# Patient Record
Sex: Male | Born: 1980 | Race: White | Hispanic: No | Marital: Married | Smoking: Never smoker
Health system: Southern US, Community
[De-identification: ages and names within clinical notes are randomized; demographics above are authoritative.]

## PROBLEM LIST (undated history)

## (undated) DIAGNOSIS — I10 Essential (primary) hypertension: Secondary | ICD-10-CM

---

## 2008-01-26 ENCOUNTER — Emergency Department: Payer: Self-pay | Admitting: Unknown Physician Specialty

## 2016-11-20 ENCOUNTER — Emergency Department
Admission: EM | Admit: 2016-11-20 | Discharge: 2016-11-20 | Disposition: A | Discharge status: Home / Self Care | Payer: Managed Care, Other (non HMO) | Attending: Emergency Medicine | Admitting: Emergency Medicine

## 2016-11-20 ENCOUNTER — Emergency Department: Payer: Managed Care, Other (non HMO)

## 2016-11-20 ENCOUNTER — Encounter: Payer: Self-pay | Admitting: Emergency Medicine

## 2016-11-20 DIAGNOSIS — N23 Unspecified renal colic: Secondary | ICD-10-CM | POA: Diagnosis not present

## 2016-11-20 DIAGNOSIS — R1031 Right lower quadrant pain: Secondary | ICD-10-CM | POA: Insufficient documentation

## 2016-11-20 DIAGNOSIS — R109 Unspecified abdominal pain: Secondary | ICD-10-CM

## 2016-11-20 LAB — CBC WITH DIFFERENTIAL/PLATELET
Basophils Absolute: 0.1 10*3/uL (ref 0–0.1)
Basophils Relative: 1 %
Eosinophils Absolute: 0.3 10*3/uL (ref 0–0.7)
Eosinophils Relative: 2 %
HEMATOCRIT: 51.4 % (ref 40.0–52.0)
HEMOGLOBIN: 17.6 g/dL (ref 13.0–18.0)
Lymphocytes Relative: 14 %
Lymphs Abs: 1.9 10*3/uL (ref 1.0–3.6)
MCH: 30.4 pg (ref 26.0–34.0)
MCHC: 34.3 g/dL (ref 32.0–36.0)
MCV: 88.7 fL (ref 80.0–100.0)
MONO ABS: 1.1 10*3/uL — AB (ref 0.2–1.0)
Monocytes Relative: 8 %
Neutro Abs: 10 10*3/uL — ABNORMAL HIGH (ref 1.4–6.5)
Neutrophils Relative %: 75 %
Platelets: 212 10*3/uL (ref 150–440)
RBC: 5.8 MIL/uL (ref 4.40–5.90)
RDW: 13.4 % (ref 11.5–14.5)
WBC: 13.4 10*3/uL — ABNORMAL HIGH (ref 3.8–10.6)

## 2016-11-20 LAB — BASIC METABOLIC PANEL
Anion gap: 11 (ref 5–15)
BUN: 13 mg/dL (ref 6–20)
CHLORIDE: 104 mmol/L (ref 101–111)
CO2: 23 mmol/L (ref 22–32)
CREATININE: 1.18 mg/dL (ref 0.61–1.24)
Calcium: 9.6 mg/dL (ref 8.9–10.3)
GFR calc Af Amer: >60 mL/min (ref >60)
GFR calc non Af Amer: >60 mL/min (ref >60)
Glucose, Bld: 126 mg/dL — ABNORMAL HIGH (ref 65–99)
POTASSIUM: 4.1 mmol/L (ref 3.5–5.1)
Sodium: 138 mmol/L (ref 135–145)

## 2016-11-20 LAB — URINALYSIS, COMPLETE (UACMP) WITH MICROSCOPIC
Bacteria, UA: NONE SEEN
Bilirubin Urine: NEGATIVE
GLUCOSE, UA: NEGATIVE mg/dL
Hgb urine dipstick: NEGATIVE
Ketones, ur: NEGATIVE mg/dL
Leukocytes, UA: NEGATIVE
Nitrite: NEGATIVE
PH: 6 (ref 5.0–8.0)
Protein, ur: 30 mg/dL — AB
Specific Gravity, Urine: 1.025 (ref 1.005–1.030)

## 2016-11-20 MED ORDER — ONDANSETRON 4 MG PO TBDP: 4.0000 mg | ORAL_TABLET | Freq: Three times a day (TID) | ORAL | 0 refills | Status: DC | PRN

## 2016-11-20 MED ORDER — SODIUM CHLORIDE 0.9 % IV BOLUS (SEPSIS)
1000.0000 mL | Freq: Once | INTRAVENOUS | Status: AC
  Administered 2016-11-20: 1000 mL via INTRAVENOUS

## 2016-11-20 MED ORDER — KETOROLAC TROMETHAMINE 10 MG PO TABS: 10.0000 mg | ORAL_TABLET | Freq: Three times a day (TID) | ORAL | 0 refills | Status: DC

## 2016-11-20 MED ORDER — KETOROLAC TROMETHAMINE 30 MG/ML IJ SOLN
30.0000 mg | Freq: Once | INTRAMUSCULAR | Status: AC
  Administered 2016-11-20: 30 mg via INTRAVENOUS
  Filled 2016-11-20: qty 1

## 2016-11-20 MED ORDER — CIPROFLOXACIN HCL 500 MG PO TABS: 500.0000 mg | ORAL_TABLET | Freq: Two times a day (BID) | ORAL | 0 refills | Status: DC

## 2016-11-20 MED ORDER — HYDROCODONE-ACETAMINOPHEN 5-325 MG PO TABS: 1.0000 | ORAL_TABLET | Freq: Four times a day (QID) | ORAL | 0 refills | Status: DC | PRN

## 2016-11-20 NOTE — Discharge Instructions (Signed)
Your labs and CT scan indicate you have probably passed a stone. You do have a small stone remaining on the left. Take the pain medicine as needed and the antibiotic as directed. Follow-up with urology for continued symptoms. Consider establishing care with a local provider for routine medical care. Return to the ED as needed.

## 2016-11-20 NOTE — ED Notes (Addendum)
Patient reports urination with blood two weeks ago when pain started, currently pain radiates from right side along abdomen. Pain reported 4/10. No longer has blood in urine that he can see. When problem started patient reports nausea

## 2016-11-20 NOTE — ED Provider Notes (Signed)
[facility] Emergency Department Provider Note ____________________________________________  Time seen: 1126  I have reviewed the triage vital signs and the nursing notes.  HISTORY  Chief Complaint  Flank Pain and Hematuria  HPI Mark Cooke is a 36 y.o. male presents to the ED with a 2-week complaint of intermittent right side pain that he describes as sharp in nature and colicky.  The patient describes pain from his mid axillary line and flank, he describes the pain onset 2 weeks prior was at a 10 out of 10.  He also describes a "sick" at the time noting nausea, vomiting, and frank hematuria.  The patient has been out of work since Monday of this week secondary to the pain.  He has had a second episode of hematuria but denies any interim fevers, urinary frequency, retention, or pelvic pain.  He does not have a history of kidney stones, but Dors is a family history of stones.  Patient describes his pain at a 4 out of 10 currently and denies any recent nausea or vomiting.  History reviewed. No pertinent past medical history.  There are no active problems to display for this patient.  History reviewed. No pertinent surgical history.  Prior to Admission medications   Medication Sig Start Date End Date Taking? Authorizing Provider  ciprofloxacin (CIPRO) 500 MG tablet Take 1 tablet (500 mg total) by mouth 2 (two) times daily. 11/20/16   Shahmeer Bunn, Charlesetta Ivory, PA-C  HYDROcodone-acetaminophen (NORCO) 5-325 MG tablet Take 1 tablet by mouth every 6 (six) hours as needed. 11/20/16   Tamica Covell, Charlesetta Ivory, PA-C  ketorolac (TORADOL) 10 MG tablet Take 1 tablet (10 mg total) by mouth every 8 (eight) hours. 11/20/16   Dail Lerew, Charlesetta Ivory, PA-C  ondansetron (ZOFRAN ODT) 4 MG disintegrating tablet Take 1 tablet (4 mg total) by mouth every 8 (eight) hours as needed. 11/20/16   Zakk Borgen, Charlesetta Ivory, PA-C    Allergies Patient has no known allergies.  No family history  on file.  Social History Social History  Substance Use Topics  . Smoking status: Never Smoker  . Smokeless tobacco: Never Used  . Alcohol use Not on file    Review of Systems  Constitutional: Negative for fever. Cardiovascular: Negative for chest pain. Respiratory: Negative for shortness of breath. Gastrointestinal: Negative for diarrhea.  Reports right-sided flank, nausea, and abdominal pain. Genitourinary: Negative for dysuria.  Reports gross hematuria Musculoskeletal: Negative for back pain. Skin: Negative for rash. Neurological: Negative for headaches, focal weakness or numbness. ____________________________________________  PHYSICAL EXAM:  VITAL SIGNS: ED Triage Vitals  Enc Vitals Group     BP 11/20/16 1152 108/68     Pulse Rate 11/20/16 1152 72     Resp 11/20/16 1152 19     Temp 11/20/16 1152 98.2 F (36.8 C)     Temp Source 11/20/16 1152 Oral     SpO2 11/20/16 1152 100 %     Weight 11/20/16 1037 225 lb (102.1 kg)     Height 11/20/16 1037 6\' 1"  (1.854 m)     Head Circumference --      Peak Flow --      Pain Score 11/20/16 1036 4     Pain Loc --      Pain Edu? --      Excl. in GC? --     Constitutional: Alert and oriented. Well appearing and in no distress. Head: Normocephalic and atraumatic. Cardiovascular: Normal rate, regular rhythm. Normal distal pulses. Respiratory:  Normal respiratory effort. No wheezes/rales/rhonchi. Gastrointestinal: Soft and mildly tender to palp over the right lower abdomen and flank. No distention, rebound, guarding, or rigidity. Normoactive bowel sounds x 4 quadrants.  Musculoskeletal: Nontender with normal range of motion in all extremities.  Neurologic:  Normal gait without ataxia. Normal speech and language. No gross focal neurologic deficits are appreciated. ____________________________________________   LABS (pertinent positives/negatives)  Labs Reviewed  URINALYSIS, COMPLETE (UACMP) WITH MICROSCOPIC - Abnormal; Notable for  the following:       Result Value   Color, Urine YELLOW (*)    APPearance CLEAR (*)    Protein, ur 30 (*)    Squamous Epithelial / LPF 0-5 (*)    All other components within normal limits  CBC WITH DIFFERENTIAL/PLATELET - Abnormal; Notable for the following:    WBC 13.4 (*)    Neutro Abs 10.0 (*)    Monocytes Absolute 1.1 (*)    All other components within normal limits  BASIC METABOLIC PANEL - Abnormal; Notable for the following:    Glucose, Bld 126 (*)    All other components within normal limits  ____________________________________________   RADIOLOGY  CT Renal Stone Study  IMPRESSION: 1. Tiny nonobstructing calculus lower pole left kidney. Minimal prostatic calcification. No ureteral calculi or hydronephrosis on either side.  2. No bowel obstruction. No abscess. Appendix appears normal.  3. Evidence of prior granulomatous disease. Incomplete visualization of 4 mm nodular opacity in inferior lingula without calcification evident. No follow-up needed if patient is low-risk. Non-contrast chest CT can be considered in 12 months if patient is high-risk. This recommendation follows the consensus statement: Guidelines for Management of Incidental Pulmonary Nodules Detected on CT Images: From the Fleischner Society 2017; Radiology 2017; 284:228-243.  4. Aortic atherosclerosis. ____________________________________________  PROCEDURES  Toradol 30 mg IVP NS 1000 ml IV bolus ____________________________________________  INITIAL IMPRESSION / ASSESSMENT AND PLAN / ED COURSE  His exam and history of consistent with a likely passage of a small renal stone.  His CT is reassuring that shows no hydronephrosis or stones on the right side.  He does have a small nonobstructive stone on the left side.  His exam shows him mild leukocytosis with a left shift. He is improved following IV fluids and Toradol. He is also reassured by his CT results. He is advised to follow-up with Dr.  Larwance SachsBabaoff for follow-up imaging of his CT evidence of granulomatous pulmonary disease, as suggested. A copy of his CT report  Is provided. He will be discharged with instructions on symptom management with Toradol, Zofran, Norco, and Cipro. Return instructions are provided.  ____________________________________________  FINAL CLINICAL IMPRESSION(S) / ED DIAGNOSES  Final diagnoses:  Right flank pain  Renal colic on right side      Lissa HoardMenshew, Laretta Pyatt V Bacon, PA-C 11/20/16 1643    Myrna BlazerSchaevitz, David Matthew, MD 11/22/16 [phone]

## 2016-11-20 NOTE — ED Notes (Signed)
Patient does not appear to be in any acute distress at time of discharge. Patient ambulatory to lobby with steady gate. Patient denies any comments or concerns regarding discharge.  

## 2016-11-20 NOTE — ED Triage Notes (Signed)
Patient presents to the ED with right "side" pain.  Patient reports area is tender on palpation and painful when he bends or twists.  Patient reports pain x 2 weeks.  Patient ambulatory to triage with no obvious distress at this time.

## 2018-08-02 ENCOUNTER — Other Ambulatory Visit: Payer: Self-pay

## 2018-08-02 ENCOUNTER — Encounter: Payer: Self-pay | Admitting: Emergency Medicine

## 2018-08-02 DIAGNOSIS — Z5321 Procedure and treatment not carried out due to patient leaving prior to being seen by health care provider: Secondary | ICD-10-CM | POA: Insufficient documentation

## 2018-08-02 DIAGNOSIS — M79671 Pain in right foot: Secondary | ICD-10-CM | POA: Insufficient documentation

## 2018-08-02 NOTE — ED Triage Notes (Signed)
Pt reports he fell over the weekend and since has had right foot pain, swelling and bruising. Swelling and bruising noted.

## 2018-08-03 ENCOUNTER — Emergency Department: Payer: Self-pay

## 2018-08-03 ENCOUNTER — Emergency Department
Admission: EM | Admit: 2018-08-03 | Discharge: 2018-08-03 | Disposition: A | Discharge status: Home / Self Care | Payer: Self-pay | Attending: Emergency Medicine | Admitting: Emergency Medicine

## 2018-08-03 NOTE — ED Notes (Signed)
No answer when called from lobby x1 

## 2018-08-03 NOTE — ED Notes (Signed)
No answer when called from lobby x3. 

## 2018-08-03 NOTE — ED Notes (Signed)
No answer when called from lobby x2. 

## 2019-08-04 ENCOUNTER — Emergency Department: Payer: Self-pay

## 2019-08-04 ENCOUNTER — Encounter: Payer: Self-pay | Admitting: Emergency Medicine

## 2019-08-04 ENCOUNTER — Emergency Department
Admission: EM | Admit: 2019-08-04 | Discharge: 2019-08-04 | Disposition: A | Discharge status: Home / Self Care | Payer: Self-pay | Attending: Student in an Organized Health Care Education/Training Program | Admitting: Student in an Organized Health Care Education/Training Program

## 2019-08-04 DIAGNOSIS — R109 Unspecified abdominal pain: Secondary | ICD-10-CM

## 2019-08-04 DIAGNOSIS — N200 Calculus of kidney: Secondary | ICD-10-CM | POA: Insufficient documentation

## 2019-08-04 LAB — COMPREHENSIVE METABOLIC PANEL
ALT: 28 U/L (ref 0–44)
AST: 26 U/L (ref 15–41)
Albumin: 4.1 g/dL (ref 3.5–5.0)
Alkaline Phosphatase: 52 U/L (ref 38–126)
Anion gap: 12 (ref 5–15)
BUN: 13 mg/dL (ref 6–20)
CO2: 23 mmol/L (ref 22–32)
Calcium: 9 mg/dL (ref 8.9–10.3)
Chloride: 101 mmol/L (ref 98–111)
Creatinine, Ser: 1.04 mg/dL (ref 0.61–1.24)
GFR calc Af Amer: >60 mL/min (ref >60)
GFR calc non Af Amer: >60 mL/min (ref >60)
Glucose, Bld: 152 mg/dL — ABNORMAL HIGH (ref 70–99)
Potassium: 3.5 mmol/L (ref 3.5–5.1)
Sodium: 136 mmol/L (ref 135–145)
Total Bilirubin: 0.5 mg/dL (ref 0.3–1.2)
Total Protein: 7.6 g/dL (ref 6.5–8.1)

## 2019-08-04 LAB — CBC
HCT: 45.7 % (ref 39.0–52.0)
Hemoglobin: 16 g/dL (ref 13.0–17.0)
MCH: 30.7 pg (ref 26.0–34.0)
MCHC: 35 g/dL (ref 30.0–36.0)
MCV: 87.5 fL (ref 80.0–100.0)
Platelets: 279 10*3/uL (ref 150–400)
RBC: 5.22 MIL/uL (ref 4.22–5.81)
RDW: 13.2 % (ref 11.5–15.5)
WBC: 12.5 10*3/uL — ABNORMAL HIGH (ref 4.0–10.5)
nRBC: 0 % (ref 0.0–0.2)

## 2019-08-04 LAB — URINALYSIS, COMPLETE (UACMP) WITH MICROSCOPIC
Bacteria, UA: NONE SEEN
Bilirubin Urine: NEGATIVE
Glucose, UA: NEGATIVE mg/dL
Hgb urine dipstick: NEGATIVE
Ketones, ur: NEGATIVE mg/dL
Leukocytes,Ua: NEGATIVE
Nitrite: NEGATIVE
Protein, ur: NEGATIVE mg/dL
Specific Gravity, Urine: 1.019 (ref 1.005–1.030)
pH: 7 (ref 5.0–8.0)

## 2019-08-04 LAB — LIPASE, BLOOD: Lipase: 34 U/L (ref 11–51)

## 2019-08-04 MED ORDER — OXYCODONE-ACETAMINOPHEN 5-325 MG PO TABS
1.0000 | ORAL_TABLET | Freq: Once | ORAL | Status: AC
  Administered 2019-08-04: 1 via ORAL
  Filled 2019-08-04: qty 1

## 2019-08-04 MED ORDER — KETOROLAC TROMETHAMINE 30 MG/ML IJ SOLN
30.0000 mg | Freq: Once | INTRAMUSCULAR | Status: AC
  Administered 2019-08-04: 30 mg via INTRAVENOUS
  Filled 2019-08-04: qty 1

## 2019-08-04 MED ORDER — TAMSULOSIN HCL 0.4 MG PO CAPS: 0.4000 mg | ORAL_CAPSULE | Freq: Every day | ORAL | 0 refills | Status: DC

## 2019-08-04 MED ORDER — OXYCODONE-ACETAMINOPHEN 5-325 MG PO TABS: 1.0000 | ORAL_TABLET | ORAL | 0 refills | Status: DC | PRN

## 2019-08-04 MED ORDER — ONDANSETRON 4 MG PO TBDP
4.0000 mg | ORAL_TABLET | Freq: Once | ORAL | Status: AC
  Administered 2019-08-04: 4 mg via ORAL
  Filled 2019-08-04: qty 1

## 2019-08-04 MED ORDER — FENTANYL CITRATE (PF) 100 MCG/2ML IJ SOLN
50.0000 ug | Freq: Once | INTRAMUSCULAR | Status: AC
  Administered 2019-08-04: 50 ug via INTRAVENOUS
  Filled 2019-08-04: qty 2

## 2019-08-04 MED ORDER — KETOROLAC TROMETHAMINE 10 MG PO TABS: 10.0000 mg | ORAL_TABLET | Freq: Four times a day (QID) | ORAL | 0 refills | Status: DC | PRN

## 2019-08-04 MED ORDER — OXYCODONE-ACETAMINOPHEN 10-325 MG PO TABS: 1.0000 | ORAL_TABLET | Freq: Four times a day (QID) | ORAL | 0 refills | Status: DC | PRN

## 2019-08-04 NOTE — Discharge Instructions (Addendum)
Follow-up with your regular doctor or [facility] as needed. Return to the emergency department if worsening Have your blood pressure rechecked either at a fire department, EMS station, or acute care later this week.  If your blood pressure continues to be elevated while you are not in pain, he should follow-up [facility] for treatment of hypertension. Take the pain medication as prescribed.  You may also want to take a stool softener if you are taking the narcotic pain medicine.  Drink plenty of fluids such as water.

## 2019-08-04 NOTE — ED Provider Notes (Signed)
[facility] Emergency Department Provider Note  ____________________________________________   First MD Initiated Contact with Patient 08/04/19 1015     (approximate)  I have reviewed the triage vital signs and the nursing notes.   HISTORY  Chief Complaint Flank Pain    HPI Becky Berberian is a 39 y.o. male presents emergency department complaining of left-sided flank pain that radiates from the back into the testicle area. No fever or chills. Vomited x1 only when he took Percocet earlier. No diarrhea. Had normal bowel movement earlier today. Patient denies a rash in the area. Denies seeing any blood in the urine.    History reviewed. No pertinent past medical history.  There are no problems to display for this patient.   History reviewed. No pertinent surgical history.  Prior to Admission medications   Medication Sig Start Date End Date Taking? Authorizing Provider  ketorolac (TORADOL) 10 MG tablet Take 1 tablet (10 mg total) by mouth every 6 (six) hours as needed. 08/04/19   Kynisha Memon, Roselyn Bering, PA-C  oxyCODONE-acetaminophen (PERCOCET) 10-325 MG tablet Take 1 tablet by mouth every 6 (six) hours as needed for pain. 08/04/19 08/03/20  Dainel Arcidiacono, Roselyn Bering, PA-C  tamsulosin (FLOMAX) 0.4 MG CAPS capsule Take 1 capsule (0.4 mg total) by mouth daily. 08/04/19   Faythe Ghee, PA-C    Allergies Patient has no known allergies.  History reviewed. No pertinent family history.  Social History Social History   Tobacco Use  . Smoking status: Never Smoker  . Smokeless tobacco: Never Used  Substance Use Topics  . Alcohol use: Not on file  . Drug use: Not on file    Review of Systems  Constitutional: No fever/chills Eyes: No visual changes. ENT: No sore throat. Respiratory: Denies cough Cardiovascular: Denies chest pain Gastrointestinal: Positive abdominal pain Genitourinary: Negative for dysuria. Musculoskeletal: Negative for back pain. Skin: Negative for  rash. Psychiatric: no mood changes,     ____________________________________________   PHYSICAL EXAM:  VITAL SIGNS: ED Triage Vitals  Enc Vitals Group     BP 08/04/19 0552 (!) 176/119     Pulse Rate 08/04/19 0552 (!) 115     Resp 08/04/19 0552 20     Temp 08/04/19 0552 99.9 F (37.7 C)     Temp Source 08/04/19 0552 Oral     SpO2 08/04/19 0552 99 %     Weight --      Height --      Head Circumference --      Peak Flow --      Pain Score 08/04/19 1024 7     Pain Loc --      Pain Edu? --      Excl. in GC? --     Constitutional: Alert and oriented. Well appearing and in no acute distress. Eyes: Conjunctivae are normal.  Head: Atraumatic. Nose: No congestion/rhinnorhea. Mouth/Throat: Mucous membranes are moist.   Neck:  supple no lymphadenopathy noted Cardiovascular: Normal rate, regular rhythm. Heart sounds are normal Respiratory: Normal respiratory effort.  No retractions, lungs c t a  Abd: soft tender in the left upper and lower quadrant, bs normal all 4 quad GU: deferred Musculoskeletal: FROM all extremities, warm and well perfused Neurologic:  Normal speech and language.  Skin:  Skin is warm, dry and intact. No rash noted. Psychiatric: Mood and affect are normal. Speech and behavior are normal.  ____________________________________________   LABS (all labs ordered are listed, but only abnormal results are displayed)  Labs Reviewed  COMPREHENSIVE METABOLIC PANEL - Abnormal; Notable for the following components:      Result Value   Glucose, Bld 152 (*)    All other components within normal limits  CBC - Abnormal; Notable for the following components:   WBC 12.5 (*)    All other components within normal limits  URINALYSIS, COMPLETE (UACMP) WITH MICROSCOPIC - Abnormal; Notable for the following components:   Color, Urine YELLOW (*)    APPearance CLEAR (*)    All other components within normal limits  LIPASE, BLOOD    ____________________________________________   ____________________________________________  RADIOLOGY  CT for renal stone study shows possibly two small stones in the left kidney.  ____________________________________________   PROCEDURES  Procedure(s) performed: No  Procedures    ____________________________________________   INITIAL IMPRESSION / ASSESSMENT AND PLAN / ED COURSE  Pertinent labs & imaging results that were available during my care of the patient were reviewed by me and considered in my medical decision making (see chart for details).   The patient is a 39 year old male presents emergency department for left-sided flank pain. See HPI. Physical exam is consistent with diverticulitis, kidney stone, or shingles  DDx: Diverticulitis, kidney stone, shingles  CBC has elevated WBC of 12.5, urinalysis is normal, lipase normal, comprehensive metabolic panel has a glucose of 152.  CT renal stone study is negative for any findings that would indicate left flank pain.  On reexamination the patient. I do have concerns for diverticulitis. We'll do a trial of Toradol to see if this helps with the pain as Percocet has not. If so then we will consider this a stone that was not caught on imaging. If Toradol does not help we'll do additional imaging with contrast.    ----------------------------------------- 11:28 AM on 08/04/2019 -----------------------------------------  Patient had a large amount of relief with Toradol.  This to me indicates more renal colic/stone.  Did explain this to the patient.  He is agreeable that we do not need to do further imaging at this time.  He was given a prescription for Toradol, Percocet, and Flomax.  He is to follow-up with his regular doctor or [facility] for the elevated blood pressure.  Return emergency department worsening.  States he understands.  He was discharged stable condition.   As part of my medical decision  making, I reviewed the following data within the electronic MEDICAL RECORD NUMBER Nursing notes reviewed and incorporated, Labs reviewed , Old chart reviewed, Radiograph reviewed , Notes from prior ED visits and [place] Controlled Substance Database  ____________________________________________   FINAL CLINICAL IMPRESSION(S) / ED DIAGNOSES  Final diagnoses:  Left flank pain  Kidney stone      NEW MEDICATIONS STARTED DURING THIS VISIT:  Discharge Medication List as of 08/04/2019 11:17 AM    START taking these medications   Details  oxyCODONE-acetaminophen (PERCOCET) 10-325 MG tablet Take 1 tablet by mouth every 6 (six) hours as needed for pain., Starting Thu 08/04/2019, Until Fri 08/03/2020 at 2359, Normal    tamsulosin (FLOMAX) 0.4 MG CAPS capsule Take 1 capsule (0.4 mg total) by mouth daily., Starting Thu 08/04/2019, Normal         Note:  This document was prepared using Dragon voice recognition software and may include unintentional dictation errors.    Faythe Ghee, PA-C 08/04/19 1129    Jene Every, MD 08/05/19 [phone]

## 2019-10-06 ENCOUNTER — Other Ambulatory Visit: Payer: Self-pay

## 2019-10-06 ENCOUNTER — Emergency Department
Admission: EM | Admit: 2019-10-06 | Discharge: 2019-10-06 | Disposition: A | Discharge status: Home / Self Care | Payer: Self-pay | Attending: Emergency Medicine | Admitting: Emergency Medicine

## 2019-10-06 DIAGNOSIS — Z79899 Other long term (current) drug therapy: Secondary | ICD-10-CM | POA: Insufficient documentation

## 2019-10-06 DIAGNOSIS — M5412 Radiculopathy, cervical region: Secondary | ICD-10-CM | POA: Insufficient documentation

## 2019-10-06 DIAGNOSIS — M25511 Pain in right shoulder: Secondary | ICD-10-CM | POA: Insufficient documentation

## 2019-10-06 DIAGNOSIS — I10 Essential (primary) hypertension: Secondary | ICD-10-CM | POA: Insufficient documentation

## 2019-10-06 HISTORY — DX: Essential (primary) hypertension: I10

## 2019-10-06 MED ORDER — LISINOPRIL 5 MG PO TABS: 5.0000 mg | ORAL_TABLET | Freq: Every day | ORAL | 0 refills | Status: AC

## 2019-10-06 MED ORDER — CYCLOBENZAPRINE HCL 5 MG PO TABS: ORAL_TABLET | ORAL | 0 refills | Status: DC

## 2019-10-06 MED ORDER — METHOCARBAMOL 1000 MG/10ML IJ SOLN
1000.0000 mg | Freq: Once | INTRAMUSCULAR | Status: DC
  Filled 2019-10-06: qty 10

## 2019-10-06 MED ORDER — LIDOCAINE 5 % EX PTCH: 1.0000 | MEDICATED_PATCH | Freq: Two times a day (BID) | CUTANEOUS | 0 refills | Status: AC

## 2019-10-06 MED ORDER — LIDOCAINE 5 % EX PTCH
1.0000 | MEDICATED_PATCH | CUTANEOUS | Status: DC
  Administered 2019-10-06: 1 via TRANSDERMAL
  Filled 2019-10-06: qty 1

## 2019-10-06 MED ORDER — KETOROLAC TROMETHAMINE 30 MG/ML IJ SOLN
30.0000 mg | Freq: Once | INTRAMUSCULAR | Status: AC
  Administered 2019-10-06: 30 mg via INTRAMUSCULAR
  Filled 2019-10-06: qty 1

## 2019-10-06 MED ORDER — METHOCARBAMOL 500 MG PO TABS
1000.0000 mg | ORAL_TABLET | Freq: Once | ORAL | Status: AC
  Administered 2019-10-06: 1000 mg via ORAL
  Filled 2019-10-06: qty 2

## 2019-10-06 NOTE — ED Triage Notes (Signed)
Pt reports woke up yesterday with pain in his neck and right shoulder. Pt states pain has gotten worse and now radiates into his chest. Pt denies SOB or other sx's, states thinks that he pulled a muscle

## 2019-10-06 NOTE — Discharge Instructions (Signed)
Your blood pressure is very high in the emergency department.  I will give you a short course of blood pressure medications.  Please be sure to get in touch with a primary care provider for recheck of the medication and recheck of your blood pressure.

## 2019-10-06 NOTE — ED Provider Notes (Signed)
[facility] Emergency Department Provider Note  ____________________________________________  Time seen: Approximately 10:24 AM  I have reviewed the triage vital signs and the nursing notes.   HISTORY  Chief Complaint Shoulder Pain and Torticollis    HPI Mark Cooke is a 39 y.o. male that presents to the emergency department for evaluation of right sided neck and shoulder pain that radiates into right arm for 3 days. Pain is elicited with movement. 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 Pain increased this morning but is easing off now. He has taken ibuprofen without relief. No fever, headache, CP, SOB.   Past Medical History:  Diagnosis Date  . Hypertension     There are no problems to display for this patient.   History reviewed. No pertinent surgical history.  Prior to Admission medications   Medication Sig Start Date End Date Taking? Authorizing Provider  cyclobenzaprine (FLEXERIL) 5 MG tablet Take 1-2 tablets 3 times daily as needed 10/06/19   Enid Derry, PA-C  lidocaine (LIDODERM) 5 % Place 1 patch onto the skin every 12 (twelve) hours. Remove & Discard patch within 12 hours or as directed by MD 10/06/19 10/05/20  Enid Derry, PA-C  lisinopril (ZESTRIL) 5 MG tablet Take 1 tablet (5 mg total) by mouth daily. 10/06/19 10/05/20  Enid Derry, PA-C  tamsulosin (FLOMAX) 0.4 MG CAPS capsule Take 1 capsule (0.4 mg total) by mouth daily. 08/04/19   Faythe Ghee, PA-C    Allergies Patient has no known allergies.  No family history on file.  Social History Social History   Tobacco Use  . Smoking status: Never Smoker  . Smokeless tobacco: Never Used  Substance Use Topics  . Alcohol use: Not on file  . Drug use: Not on file     Review of Systems  Constitutional: No fever/chills Cardiovascular: No chest pain. Respiratory: No cough. No SOB. Gastrointestinal: No abdominal pain.  No nausea, no vomiting.  Musculoskeletal:  Positive for right sided neck and shoulder pain for 3 days. Skin: Negative for rash, abrasions, lacerations, ecchymosis. Neurological: Negative for headaches   ____________________________________________   PHYSICAL EXAM:  VITAL SIGNS: ED Triage Vitals  Enc Vitals Group     BP 10/06/19 0905 (!) 179/107     Pulse Rate 10/06/19 0905 75     Resp 10/06/19 0905 20     Temp 10/06/19 0905 98.2 F (36.8 C)     Temp Source 10/06/19 0905 Oral     SpO2 10/06/19 0905 98 %     Weight 10/06/19 0904 230 lb (104.3 kg)     Height 10/06/19 0904 6\' 1"  (1.854 m)     Head Circumference --      Peak Flow --      Pain Score 10/06/19 0903 7     Pain Loc --      Pain Edu? --      Excl. in GC? --      Constitutional: Alert and oriented. Well appearing and in no acute distress. Eyes: Conjunctivae are normal. PERRL. EOMI. Head: Atraumatic. ENT:      Ears:      Nose: No congestion/rhinnorhea.      Mouth/Throat: Mucous membranes are moist.  Neck: No stridor. No cervical spine tenderness to palpation. Cardiovascular: Normal rate, regular rhythm.  Good peripheral circulation. Respiratory: Normal respiratory effort without tachypnea or retractions. Lungs CTAB. Good air entry to the bases with no decreased or absent breath sounds. Gastrointestinal: Bowel sounds 4 quadrants. Soft  and nontender to palpation. No guarding or rigidity. No palpable masses. No distention.  Musculoskeletal: Full range of motion to all extremities. No gross deformities appreciated. Neurologic:  Normal speech and language. No gross focal neurologic deficits are appreciated.  Skin:  Skin is warm, dry and intact. No rash noted. Psychiatric: Mood and affect are normal. Speech and behavior are normal. Patient exhibits appropriate insight and judgement.   ____________________________________________   LABS (all labs ordered are listed, but only abnormal results are displayed)  Labs Reviewed - No data to  display ____________________________________________  EKG   ____________________________________________  RADIOLOGY   No results found.  ____________________________________________    PROCEDURES  Procedure(s) performed:    Procedures    Medications  lidocaine (LIDODERM) 5 % 1 patch (1 patch Transdermal Patch Applied 10/06/19 1154)  ketorolac (TORADOL) 30 MG/ML injection 30 mg (30 mg Intramuscular Given 10/06/19 1151)  methocarbamol (ROBAXIN) tablet 1,000 mg (1,000 mg Oral Given 10/06/19 1153)     ____________________________________________   INITIAL IMPRESSION / ASSESSMENT AND PLAN / ED COURSE  Pertinent labs & imaging results that were available during my care of the patient were reviewed by me and considered in my medical decision making (see chart for details).  Review of the [place] CSRS was performed in accordance of the [facility] prior to dispensing any controlled drugs.   Patient's diagnosis is consistent with cervical radiculopathy.  Exam is reassuring.  Patient was given IM Toradol and oral Robaxin for symptoms.  While in the emergency department, patient's blood pressure was elevated.  He has a history of hypertension and has been prescribed medications in the past but does not currently have a primary care.  He states that his insurance should be approved this month.  His partner takes his blood pressure at home and states that sometimes it is as high as 230/110.  Patient recently had labs, which were reassuring.  He will be started on a low-dose of lisinopril and was encouraged to call primary care and follow-up now that he will have insurance.  Education about risk of stroke and heart attack was given.  Patient will be discharged home with prescriptions for lisinopril, Flexeril, Lidoderm. Patient is to follow up with primary care as directed. Patient is given ED precautions to return to the ED for any worsening or new symptoms.  Mark Cooke was evaluated in Emergency  Department on 10/06/2019 for the symptoms described in the history of present illness. He was evaluated in the context of the global COVID-19 pandemic, which necessitated consideration that the patient might be at risk for infection with the SARS-CoV-2 virus that causes COVID-19. Institutional protocols and algorithms that pertain to the evaluation of patients at risk for COVID-19 are in a state of rapid change based on information released by regulatory bodies including the CDC and federal and state organizations. These policies and algorithms were followed during the patient's care in the ED.   ____________________________________________  FINAL CLINICAL IMPRESSION(S) / ED DIAGNOSES  Final diagnoses:  Hypertension, unspecified type  Cervical radiculopathy      NEW MEDICATIONS STARTED DURING THIS VISIT:  ED Discharge Orders         Ordered    lisinopril (ZESTRIL) 5 MG tablet  Daily        10/06/19 1211    cyclobenzaprine (FLEXERIL) 5 MG tablet        10/06/19 1211    lidocaine (LIDODERM) 5 %  Every 12 hours        10/06/19 1211  This chart was dictated using voice recognition software/Dragon. Despite best efforts to proofread, errors can occur which can change the meaning. Any change was purely unintentional.    Enid Derry, PA-C 10/06/19 1240    Gilles Chiquito, MD 10/06/19 [phone]

## 2019-10-06 NOTE — ED Notes (Signed)
See triage note States he woke up with shoulder and neck pain  Denies any injury  No deformity  States pain is worse today

## 2019-10-12 ENCOUNTER — Emergency Department
Admission: EM | Admit: 2019-10-12 | Discharge: 2019-10-12 | Disposition: A | Discharge status: Home / Self Care | Payer: Self-pay | Attending: Emergency Medicine | Admitting: Emergency Medicine

## 2019-10-12 ENCOUNTER — Encounter: Payer: Self-pay | Admitting: Emergency Medicine

## 2019-10-12 ENCOUNTER — Other Ambulatory Visit: Payer: Self-pay

## 2019-10-12 DIAGNOSIS — I10 Essential (primary) hypertension: Secondary | ICD-10-CM | POA: Insufficient documentation

## 2019-10-12 DIAGNOSIS — M79621 Pain in right upper arm: Secondary | ICD-10-CM | POA: Insufficient documentation

## 2019-10-12 DIAGNOSIS — M546 Pain in thoracic spine: Secondary | ICD-10-CM | POA: Insufficient documentation

## 2019-10-12 DIAGNOSIS — M5412 Radiculopathy, cervical region: Secondary | ICD-10-CM | POA: Insufficient documentation

## 2019-10-12 DIAGNOSIS — M542 Cervicalgia: Secondary | ICD-10-CM

## 2019-10-12 DIAGNOSIS — Z79899 Other long term (current) drug therapy: Secondary | ICD-10-CM | POA: Insufficient documentation

## 2019-10-12 MED ORDER — CYCLOBENZAPRINE HCL 10 MG PO TABS: 10.0000 mg | ORAL_TABLET | Freq: Three times a day (TID) | ORAL | 0 refills | Status: DC | PRN

## 2019-10-12 MED ORDER — PREDNISONE 20 MG PO TABS: 60.0000 mg | ORAL_TABLET | Freq: Every day | ORAL | 0 refills | Status: AC

## 2019-10-12 NOTE — ED Provider Notes (Signed)
[facility] Emergency Department Provider Note   ____________________________________________   First MD Initiated Contact with Patient 10/12/19 1252     (approximate)  I have reviewed the triage vital signs and the nursing notes.   HISTORY  Chief Complaint Back Pain    HPI Mark Cooke is a 39 y.o. male with no stated past medical history the presents for 2 weeks of continuing right-sided neck, back, and right upper extremity pain.  Patient describes aching, 9/10, persistent pain in the right neck and back that radiates down his right upper extremity.  Patient states that this pain began approximately 1 week ago and has been relatively stable since onset.  Patient states that symptoms were improving until he attempted to go back to work yesterday and symptoms significantly worsened.  Patient denies any weakness/numbness/paresthesias, syncope, vision changes, shortness of breath, palpitations         Past Medical History:  Diagnosis Date  . Hypertension     There are no problems to display for this patient.   History reviewed. No pertinent surgical history.  Prior to Admission medications   Medication Sig Start Date End Date Taking? Authorizing Provider  cyclobenzaprine (FLEXERIL) 10 MG tablet Take 1 tablet (10 mg total) by mouth 3 (three) times daily as needed for muscle spasms. 10/12/19   Merwyn Katos, MD  cyclobenzaprine (FLEXERIL) 5 MG tablet Take 1-2 tablets 3 times daily as needed 10/06/19   Enid Derry, PA-C  lidocaine (LIDODERM) 5 % Place 1 patch onto the skin every 12 (twelve) hours. Remove & Discard patch within 12 hours or as directed by MD 10/06/19 10/05/20  Enid Derry, PA-C  lisinopril (ZESTRIL) 5 MG tablet Take 1 tablet (5 mg total) by mouth daily. 10/06/19 10/05/20  Enid Derry, PA-C  predniSONE (DELTASONE) 20 MG tablet Take 3 tablets (60 mg total) by mouth daily for 5 days. 10/12/19 10/17/19  Merwyn Katos, MD  tamsulosin  (FLOMAX) 0.4 MG CAPS capsule Take 1 capsule (0.4 mg total) by mouth daily. 08/04/19   Faythe Ghee, PA-C    Allergies Patient has no known allergies.  No family history on file.  Social History Social History   Tobacco Use  . Smoking status: Never Smoker  . Smokeless tobacco: Never Used  Substance Use Topics  . Alcohol use: Not on file  . Drug use: Not on file    Review of Systems Constitutional: No fever/chills Eyes: No visual changes. ENT: No sore throat. Cardiovascular: Denies chest pain. Respiratory: Denies shortness of breath. Gastrointestinal: No abdominal pain.  No nausea, no vomiting.  No diarrhea. Genitourinary: Negative for dysuria. Musculoskeletal: Negative for acute arthralgias.  Positive neck pain Skin: Negative for rash. Neurological: Negative for headaches, weakness/numbness/paresthesias in any extremity Psychiatric: Negative for suicidal ideation/homicidal ideation   ____________________________________________   PHYSICAL EXAM:  VITAL SIGNS: ED Triage Vitals [10/12/19 1234]  Enc Vitals Group     BP (!) 181/120     Pulse Rate 79     Resp 18     Temp 98.7 F (37.1 C)     Temp Source Oral     SpO2 98 %     Weight 260 lb (117.9 kg)     Height 6\' 1"  (1.854 m)     Head Circumference      Peak Flow      Pain Score 8     Pain Loc      Pain Edu?      Excl. in  GC?    Constitutional: Alert and oriented. Well appearing and in no acute distress. Eyes: Conjunctivae are normal. PERRL. EOMI. Head: Atraumatic. Nose: No congestion/rhinnorhea. Mouth/Throat: Mucous membranes are moist. Neck: No stridor, tenderness with palpation of right paraspinal musculature and with turning the head to the right Cardiovascular: Normal rate, regular rhythm. Grossly normal heart sounds.  Good peripheral circulation. Respiratory: Normal respiratory effort.  No retractions. Gastrointestinal: Soft and nontender. No distention. Musculoskeletal: No lower extremity tenderness  nor edema.  No joint effusions. Neurologic:  Normal speech and language. No gross focal neurologic deficits are appreciated. Skin:  Skin is warm and dry. No rash noted. Psychiatric: Mood and affect are normal. Speech and behavior are normal.  ____________________________________________ ____________________________________________   PROCEDURES  Procedure(s) performed (including Critical Care):  Procedures   ____________________________________________   INITIAL IMPRESSION / ASSESSMENT AND PLAN / ED COURSE        Patient presents for neck pain pain. Given History and Exam the patient appears to be at low risk for Spinal Cord Compression Syndrome, Vertebral Malignancy/Mets, acute Spinal Fracture, Vertebral Osteomyelitis, Epidural Abscess.  Their presentation appears most likely to be secondary to non-emergent musculoskeletal etiology vs non-emergent disc herniation.  ED Workup: Defer imaging and labwork for outpatient follow up at this time.  Disposition: Discharge. Strict return precautions discussed with patient with full understanding. Advised patient to follow up promptly with primary care provider      ____________________________________________   FINAL CLINICAL IMPRESSION(S) / ED DIAGNOSES  Final diagnoses:  Right cervical radiculopathy  Acute right-sided thoracic back pain  Neck pain     ED Discharge Orders         Ordered    cyclobenzaprine (FLEXERIL) 10 MG tablet  3 times daily PRN        10/12/19 1307    predniSONE (DELTASONE) 20 MG tablet  Daily        10/12/19 1307           Note:  This document was prepared using Dragon voice recognition software and may include unintentional dictation errors.   Merwyn Katos, MD 10/12/19 1311

## 2019-10-12 NOTE — ED Triage Notes (Signed)
Presents with cont's back pain  States he was seen last week for same  States pain is mid back and moving into right arm/chest

## 2021-02-14 ENCOUNTER — Telehealth: Payer: Self-pay

## 2021-02-14 NOTE — Telephone Encounter (Signed)
APPT MADE

## 2021-02-14 NOTE — Telephone Encounter (Signed)
Copied from CRM [id number]. Topic: General - Other >> Feb 14, 2021  3:31 PM Fields, Hospital doctor R wrote: Front desk told me to leave name number of pt wife so she can call back and make an appt [phone] [facility]

## 2021-04-04 ENCOUNTER — Ambulatory Visit: Payer: Self-pay | Admitting: Internal Medicine

## 2021-07-16 ENCOUNTER — Emergency Department: Payer: Self-pay

## 2021-07-16 ENCOUNTER — Other Ambulatory Visit: Payer: Self-pay

## 2021-07-16 ENCOUNTER — Emergency Department
Admission: EM | Admit: 2021-07-16 | Discharge: 2021-07-16 | Disposition: A | Discharge status: Home / Self Care | Payer: Self-pay | Attending: Emergency Medicine | Admitting: Emergency Medicine

## 2021-07-16 ENCOUNTER — Encounter: Payer: Self-pay | Admitting: Emergency Medicine

## 2021-07-16 DIAGNOSIS — R519 Headache, unspecified: Secondary | ICD-10-CM | POA: Insufficient documentation

## 2021-07-16 DIAGNOSIS — H53149 Visual discomfort, unspecified: Secondary | ICD-10-CM | POA: Insufficient documentation

## 2021-07-16 DIAGNOSIS — R110 Nausea: Secondary | ICD-10-CM | POA: Insufficient documentation

## 2021-07-16 MED ORDER — DIPHENHYDRAMINE HCL 50 MG/ML IJ SOLN
25.0000 mg | Freq: Once | INTRAMUSCULAR | Status: AC
  Administered 2021-07-16: 25 mg via INTRAVENOUS
  Filled 2021-07-16: qty 1

## 2021-07-16 MED ORDER — DEXAMETHASONE SODIUM PHOSPHATE 10 MG/ML IJ SOLN
10.0000 mg | Freq: Once | INTRAMUSCULAR | Status: AC
  Administered 2021-07-16: 10 mg via INTRAVENOUS
  Filled 2021-07-16: qty 1

## 2021-07-16 MED ORDER — METOCLOPRAMIDE HCL 5 MG/ML IJ SOLN
10.0000 mg | Freq: Once | INTRAMUSCULAR | Status: AC
  Administered 2021-07-16: 10 mg via INTRAVENOUS
  Filled 2021-07-16: qty 2

## 2021-07-16 MED ORDER — SODIUM CHLORIDE 0.9 % IV BOLUS
1000.0000 mL | Freq: Once | INTRAVENOUS | Status: AC
  Administered 2021-07-16: 1000 mL via INTRAVENOUS

## 2021-07-16 NOTE — Discharge Instructions (Signed)
Call make an appoint with your primary care provider if any continued problems or concerns.  Continue to drink fluids today to stay hydrated.

## 2021-07-16 NOTE — ED Notes (Signed)
See triage note  presents with headache for about 2 weeks  positive nausea  denies any fever or injury  states pain is to back of head

## 2021-07-16 NOTE — ED Provider Notes (Signed)
[facility] Provider Note    Event Date/Time   First MD Initiated Contact with Patient 07/16/21 [phone]     (approximate)   History   Migraine   HPI  Mark Cooke is a 41 y.o. male   presents to the ED with complaint of headache intermittently for the last 6 months.  Patient reports that other members in his family have "migraine headaches" but he has never been diagnosed and this is the first time he has had difficulty.  In the last 2 days he states the headaches have become worse.  He reports that the headaches are frontal and go posteriorly.  He endorses photophobia, nausea without vomiting.  Patient took ibuprofen at approximately 3 AM today.      Physical Exam   Triage Vital Signs: ED Triage Vitals [07/16/21 0846]  Enc Vitals Group     BP      Pulse      Resp      Temp      Temp src      SpO2      Weight 250 lb (113.4 kg)     Height 6\' 1"  (1.854 m)     Head Circumference      Peak Flow      Pain Score 7     Pain Loc      Pain Edu?      Excl. in GC?     Most recent vital signs: Vitals:   07/16/21 0957 07/16/21 1119  BP: (!) 168/88 (!) 160/80  Pulse: 80 79  Resp: 18 18  Temp:    SpO2: 98% 98%     General: Awake, no distress.  Able to answer questions.  Mild photophobia. CV:  Good peripheral perfusion.  Heart regular rate and rhythm. Resp:  Normal effort.  Lungs are clear bilaterally. Abd:  No distention.  Other:  PERRLA, EOMI's, conjunctive are clear bilaterally.  Cranial nerves II through XII grossly intact.  Good muscle strength bilaterally at 5/5.  Patient is able to ambulate without any assistance.  Speech is normal.   ED Results / Procedures / Treatments   Labs (all labs ordered are listed, but only abnormal results are displayed) Labs Reviewed - No data to display   RADIOLOGY CT head images reviewed by me and radiology report is negative for any acute intracranial changes.    PROCEDURES:  Critical Care  performed:   Procedures   MEDICATIONS ORDERED IN ED: Medications  sodium chloride 0.9 % bolus 1,000 mL (0 mLs Intravenous Stopped 07/16/21 1127)  metoCLOPramide (REGLAN) injection 10 mg (10 mg Intravenous Given 07/16/21 0950)  dexamethasone (DECADRON) injection 10 mg (10 mg Intravenous Given 07/16/21 0950)  diphenhydrAMINE (BENADRYL) injection 25 mg (25 mg Intravenous Given 07/16/21 0950)     IMPRESSION / MDM / ASSESSMENT AND PLAN / ED COURSE  I reviewed the triage vital signs and the nursing notes.   Differential diagnosis includes, but is not limited to, intracranial lesion, chronic headache with acute exacerbation, migraine, muscle tension headache.  41 year old male presents to the ED with complaint of headache for the past 6 months intermittently.  Patient states that family members have history of migraines but he has never been diagnosed or evaluated.  He states the last 2 days his headaches have become worse and is associated with photophobia, nausea without vomiting.  Patient has taken over-the-counter medication without any relief.  CT scan of the head was reassuring with no acute intracranial changes.  Patient was made aware.  He agrees that he can find someone to pick him up and we will proceed with the migraine cocktail.  Prior to discharge patient was feeling much better.  He is to follow-up with his PCP if any continued problems or concerns.  ----------------------------------------- 11:19 AM on 07/16/2021 ----------------------------------------- States that he is feeling much better at this time.    Patient's presentation is most consistent with acute complicated illness / injury requiring diagnostic workup.  FINAL CLINICAL IMPRESSION(S) / ED DIAGNOSES   Final diagnoses:  Generalized headache     Rx / DC Orders   ED Discharge Orders     None        Note:  This document was prepared using Dragon voice recognition software and may include unintentional  dictation errors.   Tommi Rumps, PA-C 07/16/21 1227    Chesley Noon, MD 07/17/21 Barry Brunner

## 2021-07-16 NOTE — ED Triage Notes (Signed)
Pt via POV from home. Pt c/o migraine for he past 2 days, also endorses nausea, light, and photosensitivity. States that he has been having, more headaches in the past 6 months. Pt is A&Ox4 and NAD.

## 2021-07-16 NOTE — ED Notes (Signed)
Feels better  Provider aware

## 2022-06-09 IMAGING — CT CT RENAL STONE PROTOCOL
2 of 4 series · 16 of 46 positions shown, 18 images · non-contrast
Comparison: 11/20/2016

CLINICAL DATA: Left flank pain.  Abdominal pain.

EXAM:
CT ABDOMEN AND PELVIS WITHOUT CONTRAST
TECHNIQUE: Multidetector CT imaging of the abdomen and pelvis was performed
following the standard protocol without IV contrast.

[Series 2: stone full standard · axial · 0.86mm/px · z∈[-914,-448]mm · 13 of 103 slices shown, 15 images]
[im 5/103  soft-tissue]
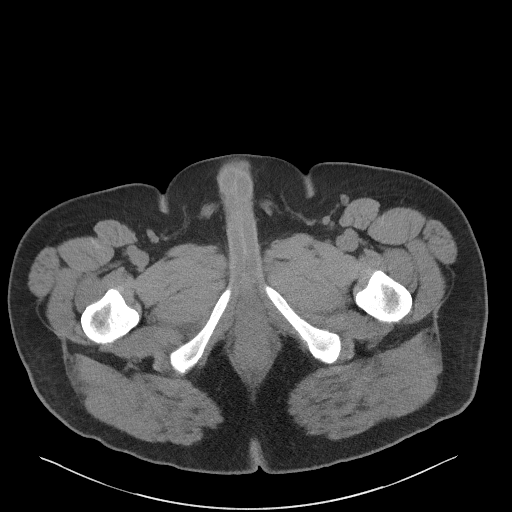
[im 5/103  bone]
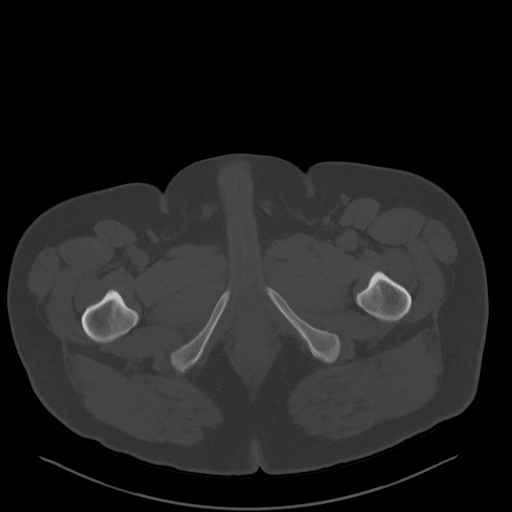
[im 13/103  soft-tissue]
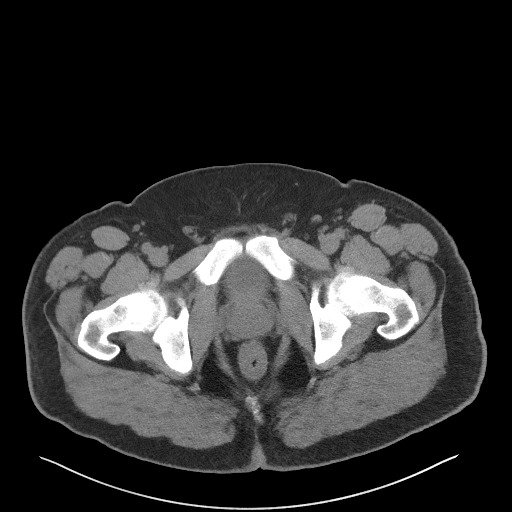
[im 21/103  soft-tissue]
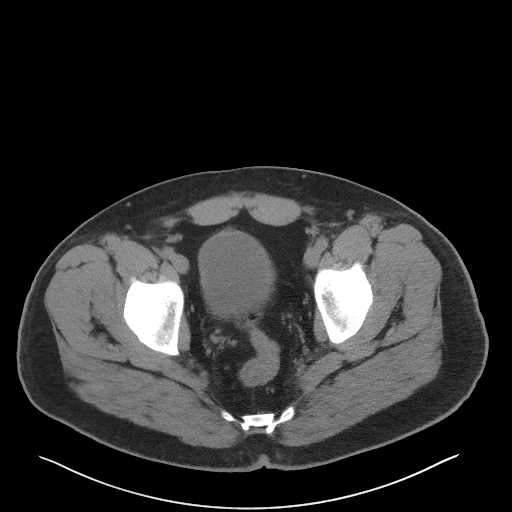
[im 29/103  soft-tissue]
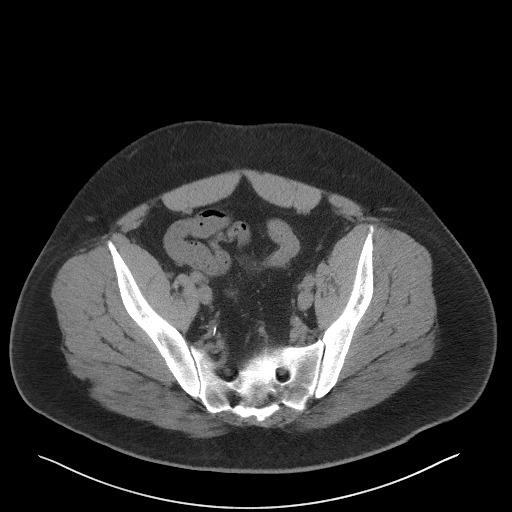
[im 37/103  soft-tissue]
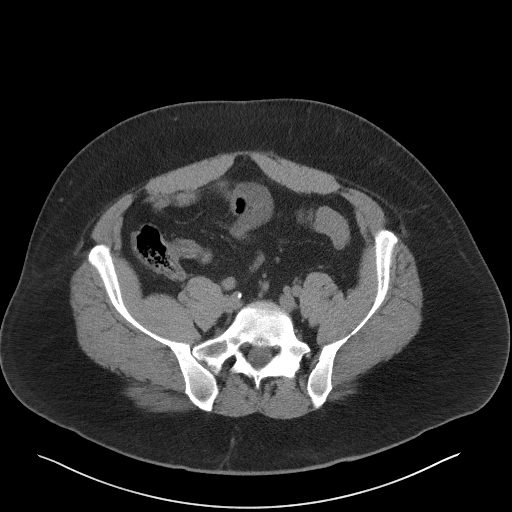
[im 45/103  soft-tissue]
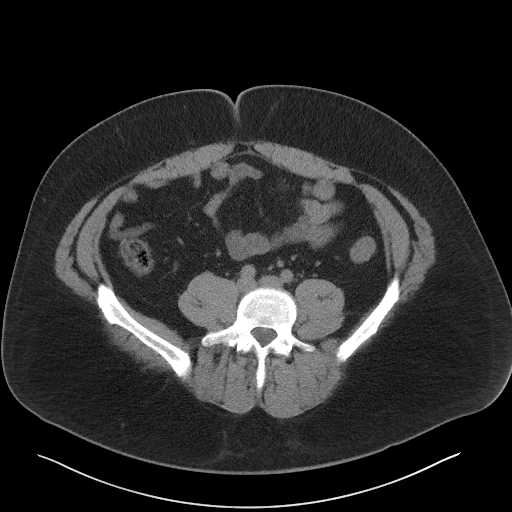
[im 54/103  soft-tissue]
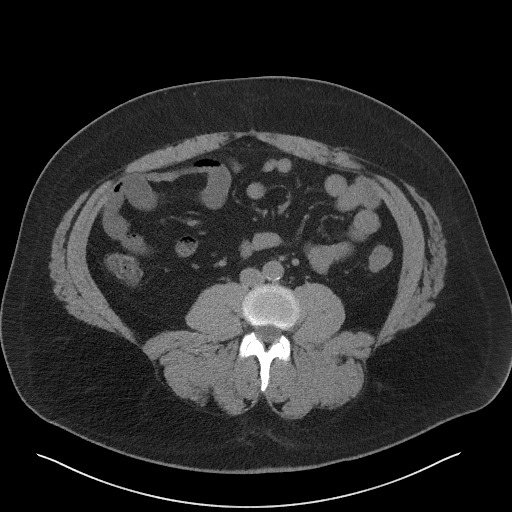
[im 58/103  soft-tissue]
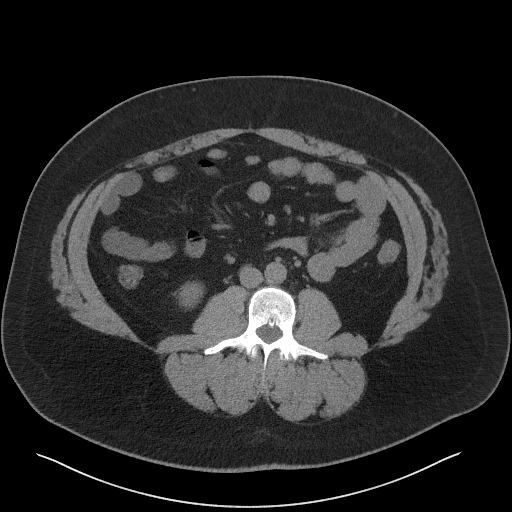
[im 66/103  soft-tissue]
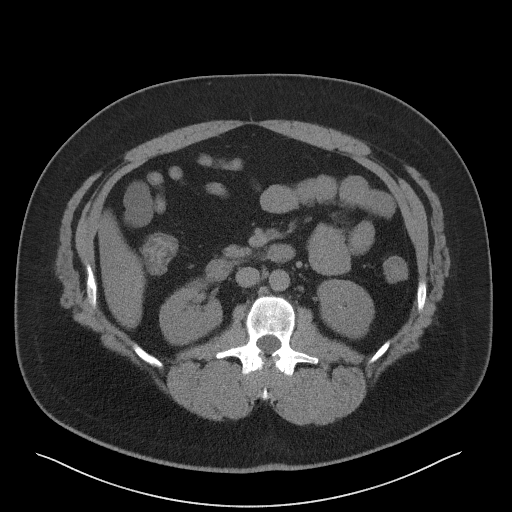
[im 66/103  bone]
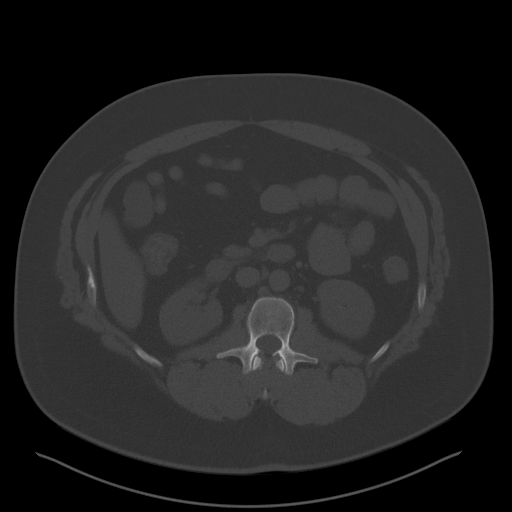
[im 74/103  soft-tissue]
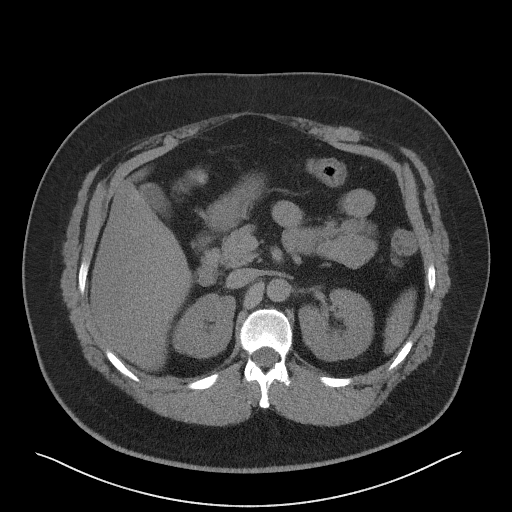
[im 82/103  soft-tissue]
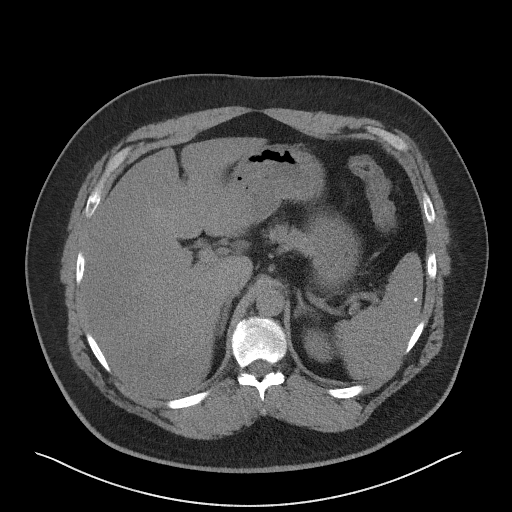
[im 90/103  soft-tissue]
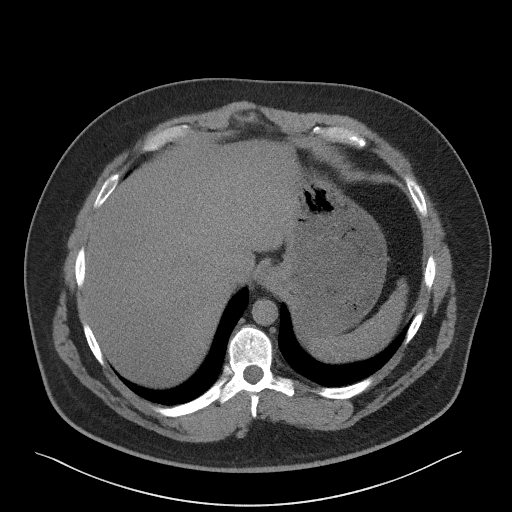
[im 98/103  soft-tissue]
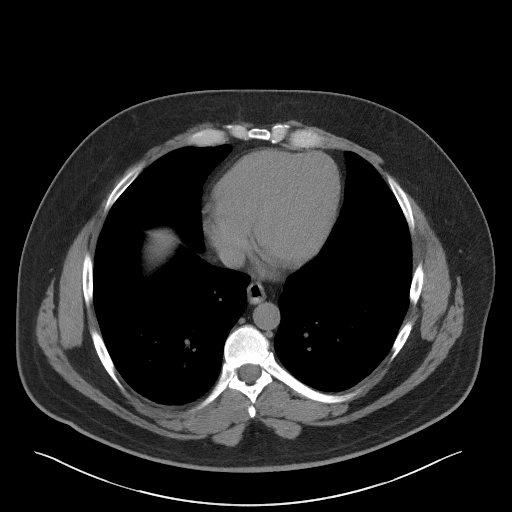

[Series 5: coronal · coronal · 0.86mm/px · 3 of 146 slices shown]
[im 49/146  soft-tissue]
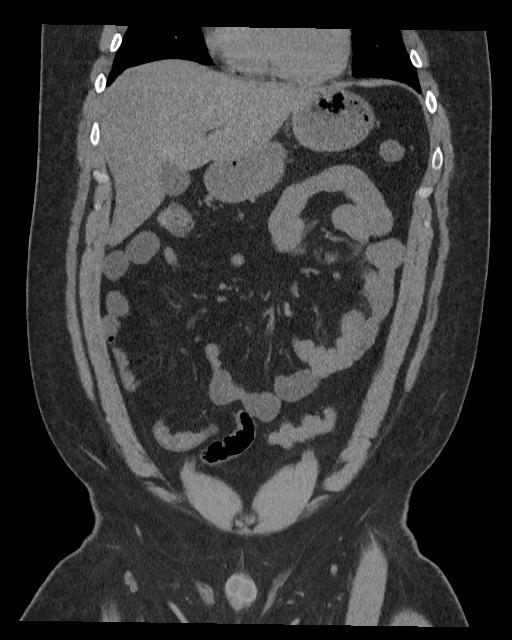
[im 65/146  soft-tissue]
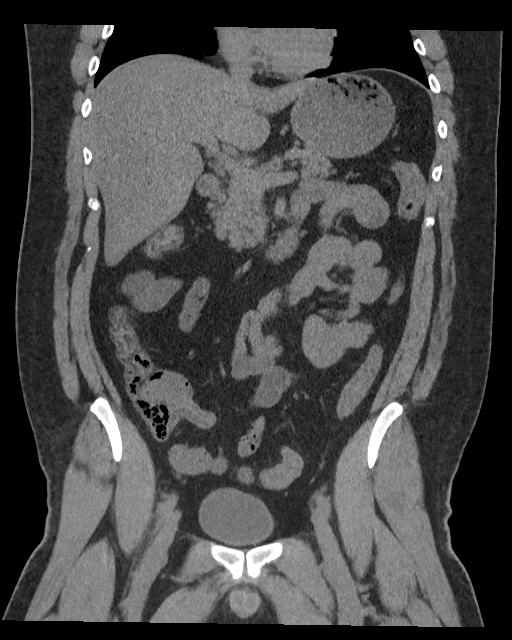
[im 81/146  soft-tissue]
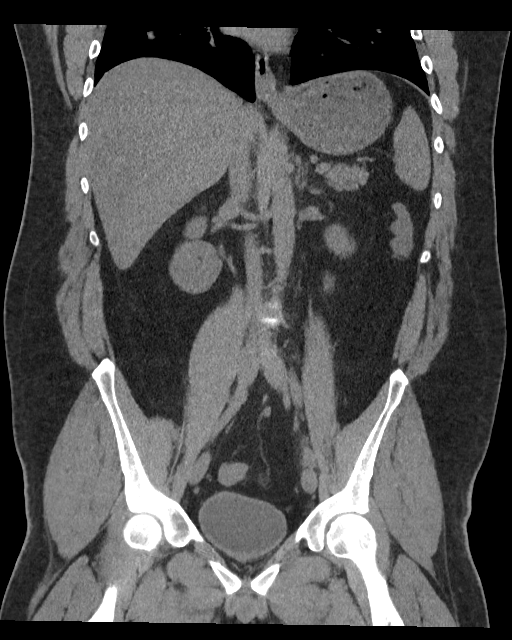

[16 of 46 positions shown; findings below may reference images not displayed]

FINDINGS: Lower chest: Old granulomatous disease with calcified granulomas in
the right middle lobe and right lower lobe. Calcified right
infrahilar lymph node and calcified right lower paraesophageal lymph
node. There is also a small calcified granuloma in the left lower
lobe.

Hepatobiliary: Mild hepatic steatosis.  Otherwise unremarkable.

Pancreas: Unremarkable

Spleen: Punctate calcifications compatible with old granulomatous
disease.

Adrenals/Urinary Tract: Unremarkable

Stomach/Bowel: The appearance on image 55/5 raise the possibility of
1-2 punctate stones in the 1-2 mm range in the left kidney lower
pole. No hydronephrosis or hydroureter. No ureteral calculus or
bladder calculus observed.

Vascular/Lymphatic: Aortoiliac atherosclerotic vascular disease.

Reproductive: Unremarkable

Other: No supplemental non-categorized findings.

Musculoskeletal: A transitional lumbosacral vertebra is assumed to
represent the S1 level. Careful correlation with this numbering
strategy prior to any procedural intervention would be recommended.
Disc bulge and possible central disc protrusion at the L4-5 level.
IMPRESSION: 1. A specific cause for the patient's left flank pain is not
identified.
2. Questionable 1-2 punctate nonobstructive stones in the left
kidney lower pole.
3. Mild hepatic steatosis.
4. Old granulomatous disease.
5. Disc bulge and possible central disc protrusion at L4-5.
6. Transitional lumbosacral vertebra assumed to represent the S1
level. Careful correlation with this numbering strategy prior to any
procedural intervention would be recommended.
7. Aortic atherosclerosis.

Aortic Atherosclerosis ([id number].P).

## 2023-11-05 ENCOUNTER — Emergency Department
Admission: EM | Admit: 2023-11-05 | Discharge: 2023-11-07 | Disposition: A | Discharge status: Other Acute Inpatient Hospital | Payer: Self-pay | Attending: Emergency Medicine | Admitting: Emergency Medicine

## 2023-11-05 ENCOUNTER — Other Ambulatory Visit: Payer: Self-pay

## 2023-11-05 DIAGNOSIS — I10 Essential (primary) hypertension: Secondary | ICD-10-CM | POA: Insufficient documentation

## 2023-11-05 DIAGNOSIS — R45851 Suicidal ideations: Secondary | ICD-10-CM

## 2023-11-05 DIAGNOSIS — T50902A Poisoning by unspecified drugs, medicaments and biological substances, intentional self-harm, initial encounter: Secondary | ICD-10-CM

## 2023-11-05 DIAGNOSIS — F419 Anxiety disorder, unspecified: Secondary | ICD-10-CM | POA: Insufficient documentation

## 2023-11-05 DIAGNOSIS — F329 Major depressive disorder, single episode, unspecified: Secondary | ICD-10-CM | POA: Insufficient documentation

## 2023-11-05 DIAGNOSIS — T481X2A Poisoning by skeletal muscle relaxants [neuromuscular blocking agents], intentional self-harm, initial encounter: Secondary | ICD-10-CM | POA: Insufficient documentation

## 2023-11-05 LAB — CBC WITH DIFFERENTIAL/PLATELET
Abs Immature Granulocytes: 0.04 K/uL (ref 0.00–0.07)
Basophils Absolute: 0.1 K/uL (ref 0.0–0.1)
Basophils Relative: 1 %
Eosinophils Absolute: 0.1 K/uL (ref 0.0–0.5)
Eosinophils Relative: 1 %
HCT: 46.6 % (ref 39.0–52.0)
Hemoglobin: 16.1 g/dL (ref 13.0–17.0)
Immature Granulocytes: 0 %
Lymphocytes Relative: 25 %
Lymphs Abs: 2.6 K/uL (ref 0.7–4.0)
MCH: 31.4 pg (ref 26.0–34.0)
MCHC: 34.5 g/dL (ref 30.0–36.0)
MCV: 91 fL (ref 80.0–100.0)
Monocytes Absolute: 0.8 K/uL (ref 0.1–1.0)
Monocytes Relative: 7 %
Neutro Abs: 7.1 K/uL (ref 1.7–7.7)
Neutrophils Relative %: 66 %
Platelets: 220 K/uL (ref 150–400)
RBC: 5.12 MIL/uL (ref 4.22–5.81)
RDW: 12 % (ref 11.5–15.5)
WBC: 10.8 K/uL — ABNORMAL HIGH (ref 4.0–10.5)
nRBC: 0 % (ref 0.0–0.2)

## 2023-11-05 LAB — COMPREHENSIVE METABOLIC PANEL WITH GFR
ALT: 24 U/L (ref 0–44)
AST: 17 U/L (ref 15–41)
Albumin: 3.9 g/dL (ref 3.5–5.0)
Alkaline Phosphatase: 49 U/L (ref 38–126)
Anion gap: 11 (ref 5–15)
BUN: 9 mg/dL (ref 6–20)
CO2: 25 mmol/L (ref 22–32)
Calcium: 9.5 mg/dL (ref 8.9–10.3)
Chloride: 105 mmol/L (ref 98–111)
Creatinine, Ser: 1.11 mg/dL (ref 0.61–1.24)
GFR, Estimated: >60 mL/min (ref >60)
Glucose, Bld: 87 mg/dL (ref 70–99)
Potassium: 3.4 mmol/L — ABNORMAL LOW (ref 3.5–5.1)
Sodium: 141 mmol/L (ref 135–145)
Total Bilirubin: 0.6 mg/dL (ref 0.0–1.2)
Total Protein: 7.4 g/dL (ref 6.5–8.1)

## 2023-11-05 LAB — MAGNESIUM: Magnesium: 2.4 mg/dL (ref 1.7–2.4)

## 2023-11-05 LAB — ETHANOL: Alcohol, Ethyl (B): 28 mg/dL — ABNORMAL HIGH (ref <15)

## 2023-11-05 NOTE — ED Notes (Signed)
 PT dressed out all but underwear. Pt yelling at staff and stating he is not going to change them out for hospital provided ones or go without. Stated we can not make him and he will walk out of here. It was explained to pt that he is her under IVC and can not leave. [employer] officer to room. EDP updated on pt's behavior.   EDP instructed this RN that pt can keep his own underwear on at this time. Will try again later to have pt remove them and put them with his other personal belongings.

## 2023-11-05 NOTE — BH Assessment (Signed)
 Comprehensive Clinical Assessment (CCA) Note  11/05/2023 Mark Cooke [id number]  Chief Complaint:  Chief Complaint  Patient presents with   Psychiatric Evaluation   Mark Cooke arrived to the ED by way of EMS.  He reports that "I took some asprin, muscle relaxers, and drank alcohol.  I had suicidal thoughts".  He reports that he has been having suicidal thoughts, "quite often" over the past two weeks. He reports that he has been under a lot of stress from multiple areas.  He reports symptoms of depression.  He states, "I get inside my brain and I can't make it stop, I can't focus, I cant go through my day to day life.  I recently stopped drinking alcohol, "I am a recovering alcoholic" he shared that he has gone 1 month without alcohol.  He reports that he is having problems with his girlfriend and is facing financial problems.  He reports "I have anxiety all day everyday".  He reports that he smokes marijuana to minimize his anxiety symptoms.  He denied having auditory or visual hallucinations.  He denied homicidal ideation or intent.  He reports that he used to drink daily a pint to a fifth of vodka daily.  He stopped "cold mal[place]".  He stated that he started drinking beer about a week ago.     Visit Diagnosis: Major Depressive Disorder, Anxiety Disorder   CCA Screening, Triage and Referral (STR)  Patient Reported Information How did you hear about us ? Self  What Is the Reason for Your Visit/Call Today? Suicidal intent  How Long Has This Been Causing You Problems? 1 wk - 1 month  What Do You Feel Would Help You the Most Today? Treatment for Depression or other mood problem; Alcohol or Drug Use Treatment   Have You Recently Had Any Thoughts About Hurting Yourself? Yes  Are You Planning to Commit Suicide/Harm Yourself At This time? Yes   Flowsheet Row ED from 07/16/2021 in [facility] Emergency Department at [facility]  C-SSRS RISK CATEGORY No Risk    Have you Recently  Had Thoughts About Hurting Someone Sherral? No  Are You Planning to Harm Someone at This Time? No  Explanation: No data recorded  Have You Used Any Alcohol or Drugs in the Past 24 Hours? Yes  How Long Ago Did You Use Drugs or Alcohol? No data recorded What Did You Use and How Much? Alcohol and Marijuana   Do You Currently Have a Therapist/Psychiatrist? No  Name of Therapist/Psychiatrist:    Have You Been Recently Discharged From Any Office Practice or Programs? No  Explanation of Discharge From Practice/Program: No data recorded    CCA Screening Triage Referral Assessment Type of Contact: Face-to-Face  Telemedicine Service Delivery:   Is this Initial or Reassessment?   Date Telepsych consult ordered in [facility]:    Time Telepsych consult ordered in [facility]:    Location of Assessment: [facility] ED  Provider Location: [facility] ED   Collateral Involvement: No data recorded  Does Patient Have a Court Appointed Legal Guardian? No  Legal Guardian Contact Information: No data recorded Copy of Legal Guardianship Form: No data recorded Legal Guardian Notified of Arrival: No data recorded Legal Guardian Notified of Pending Discharge: No data recorded If Minor and Not Living with Parent(s), Who has Custody? No data recorded Is CPS involved or ever been involved? Never  Is APS involved or ever been involved? No data recorded  Patient Determined To Be At Risk for Harm To Self or Others Based  on Review of Patient Reported Information or Presenting Complaint? Yes, for Self-Harm  Method: No data recorded Availability of Means: No data recorded Intent: Clearly intends on inflicting harm that could cause death  Notification Required: No data recorded Additional Information for Danger to Others Potential: No data recorded Additional Comments for Danger to Others Potential: No data recorded Are There Guns or Other Weapons in Your Home? No  Types of Guns/Weapons: No data recorded Are These Weapons  Safely Secured?                            No data recorded Who Could Verify You Are Able To Have These Secured: No data recorded Do You Have any Outstanding Charges, Pending Court Dates, Parole/Probation? No data recorded Contacted To Inform of Risk of Harm To Self or Others: No data recorded   Does Patient Present under Involuntary Commitment? No    [place] of Residence: [place]   Patient Currently Receiving the Following Services: No data recorded  Determination of Need: No data recorded  Options For Referral: Inpatient Hospitalization     CCA Biopsychosocial Patient Reported Schizophrenia/Schizoaffective Diagnosis in Past: No data recorded  Strengths: No data recorded  Mental Health Symptoms Depression:  Change in energy/activity; Difficulty Concentrating; Fatigue; Hopelessness; Increase/decrease in appetite; Irritability; Tearfulness; Worthlessness   Duration of Depressive symptoms: Duration of Depressive Symptoms: Greater than two weeks   Mania:  None   Anxiety:   Difficulty concentrating; Fatigue; Irritability; Restlessness; Worrying   Psychosis:  None   Duration of Psychotic symptoms:    Trauma:  None   Obsessions:  N/A   Compulsions:  N/A   Inattention:  No data recorded  Hyperactivity/Impulsivity:  N/A   Oppositional/Defiant Behaviors:  N/A   Emotional Irregularity:  N/A   Other Mood/Personality Symptoms:  No data recorded   Mental Status Exam Appearance and self-care  Stature:  Average   Weight:  No data recorded  Clothing:  Casual   Grooming:  Normal   Cosmetic use:  None   Posture/gait:  Normal   Motor activity:  Not Remarkable   Sensorium  Attention:  Normal   Concentration:  Normal   Orientation:  X5   Recall/memory:  Normal   Affect and Mood  Affect:  Depressed   Mood:  Depressed   Relating  Eye contact:  Fleeting   Facial expression:  No data recorded  Attitude toward examiner:  Cooperative   Thought and  Language  Speech flow: Clear and Coherent   Thought content:  Appropriate to Mood and Circumstances   Preoccupation:  None   Hallucinations:  None   Organization:  Coherent   [employer] of Knowledge:  Good   Intelligence:  Average   Abstraction:  Normal   Judgement:  Fair   Dance movement psychotherapist:  No data recorded  Insight:  Good   Decision Making:  Impulsive   Social Functioning  Social Maturity:  No data recorded  Social Judgement:  No data recorded  Stress  Stressors:  Surveyor, quantity; Relationship   Coping Ability:  Exhausted   Skill Deficits:  None   Supports:  Friends/Service system; Family     Religion: Religion/Spirituality Are You A Religious Person?: Yes What is Your Religious Affiliation?: Environmental consultant: Leisure / Recreation Do You Have Hobbies?: Yes Leisure and Hobbies: Fishing, Architect, Working on cars, and traveling  Exercise/Diet: Exercise/Diet Do You Exercise?: Yes What Type of Exercise Do You Do?:  Run/Walk How Many Times a Week Do You Exercise?: Daily Have You Gained or Lost A Significant Amount of Weight in the Past Six Months?: No Do You Follow a Special Diet?: No Do You Have Any Trouble Sleeping?: No   CCA Employment/Education Employment/Work Situation: Employment / Work Situation Employment Situation: Employed Work Stressors: None reported Patient's Job has Been Impacted by Current Illness: No Has Patient ever Been in Equities trader?: No  Education: Education Is Patient Currently Attending School?: No Last Grade Completed: 10 Did You Product manager?: No Did You Have An Individualized Education Program (IIEP): No Did You Have Any Difficulty At [employer]?: Yes Were Any Medications Ever Prescribed For These Difficulties?: No Patient's Education Has Been Impacted by Current Illness: No   CCA Family/Childhood History Family and Relationship History: Family history Marital status: Separated Does patient have  children?: Yes How many children?: 4 How is patient's relationship with their children?: Not good  Childhood History:  Childhood History By whom was/is the patient raised?: Both parents Did patient suffer any verbal/emotional/physical/sexual abuse as a child?: No Did patient suffer from severe childhood neglect?: No Has patient ever been sexually abused/assaulted/raped as an adolescent or adult?: No Was the patient ever a victim of a crime or a disaster?: No Witnessed domestic violence?: No Has patient been affected by domestic violence as an adult?: No       CCA Substance Use Alcohol/Drug Use: Alcohol / Drug Use History of alcohol / drug use?: Yes Substance #1 Name of Substance 1: Alcohol 1 - Age of First Use: 16 1 - Amount (size/oz): about a fifth of vodka, today drank beer 1 - Frequency: daily 1 - Last Use / Amount: 11/05/2023 1 - Method of Aquiring: purchasing 1- Route of Use: oral Substance #2 Name of Substance 2: Marijuana 2 - Age of First Use: 15 2 - Amount (size/oz): a lot 2 - Frequency: daily to a few times a month 2 - Last Use / Amount: 11/05/2023 2 - Route of Substance Use: inhalation                     ASAM's:  Six Dimensions of Multidimensional Assessment  Dimension 1:  Acute Intoxication and/or Withdrawal Potential:      Dimension 2:  Biomedical Conditions and Complications:      Dimension 3:  Emotional, Behavioral, or Cognitive Conditions and Complications:     Dimension 4:  Readiness to Change:     Dimension 5:  Relapse, Continued use, or Continued Problem Potential:     Dimension 6:  Recovery/Living Environment:     ASAM Severity Score:    ASAM Recommended Level of Treatment:     Substance use Disorder (SUD) Substance Use Disorder (SUD)  Checklist Symptoms of Substance Use: Substance(s) often taken in larger amounts or over longer times than was intended  Recommendations for Services/Supports/Treatments: Recommendations for  Services/Supports/Treatments Recommendations For Services/Supports/Treatments: Inpatient Hospitalization  Disposition Recommendation per psychiatric provider: {CHLmaccldispo:31820}   DSM5 Diagnoses: There are no active problems to display for this patient.    Referrals to Alternative Service(s): Referred to Alternative Service(s):   Place:   Date:   Time:    Referred to Alternative Service(s):   Place:   Date:   Time:    Referred to Alternative Service(s):   Place:   Date:   Time:    Referred to Alternative Service(s):   Place:   Date:   Time:     Nanetta Paula, Counselor

## 2023-11-05 NOTE — ED Triage Notes (Signed)
 Pt to ED via [facility] from home for depression and anxiety. Pt stated he lives alone but has been having issues with his girlfriend that are causing him to be depressed and have thoughts of hurting himself.   Pt told EMS that he didn't do anything today with the intent of self harm. Pt drank some wine and took 4 muscle relaxers (unsure of the name and dose) and 10 baby aspirin.   EMS Vials:  BP: 180/100 HR:88 O2:98%  Pt is dizzy and experiencing some weakness. A&Ox4. He doesn't know who called 911 but thinks it was probably a parent or sister who lives across the street from him.

## 2023-11-05 NOTE — ED Notes (Signed)
 Pt dressed out by this tech and Arianne, EDT. Pt REFUSED to take off personal underwear. Pt stated he would walk out this joint if we forced him to take his underwear off. Explained to pt he wouldn't be able to leave if he tried to walk out due to IVC, but, pt still refused to take off underwear. Luke, RN notified.   Pt belongings include:  Green shirt Plaid pants Black socks Black shoes Phone with no cracks Field seismologist

## 2023-11-05 NOTE — ED Notes (Signed)
 Spoke with Damien, RN at [employer]. Recommendations: - 24H observation period - Magnesium Level  -Repeat EKG in 4-6 hours - Repeat Acetaminophen  level at 0100 - Repeat Salicylate level 3hrs after first level was drawn  -Aspirin ingestion was under sub toxic level.   - Cardiac monitoring and supportive care such as fluids or anti medic medication.

## 2023-11-05 NOTE — ED Provider Notes (Signed)
 [facility] Provider Note    Event Date/Time   First MD Initiated Contact with Patient 11/05/23 2147     (approximate)   History   Chief Complaint Psychiatric Evaluation   HPI  Mark Cooke is a 43 y.o. male with past medical history of hypertension who presents to the ED for psychiatric evaluation.  Patient reports that he has been depressed recently and took 5 tablets of Flexeril  this evening along with 10 baby aspirin in an attempt to harm himself.  He admits to consuming some alcohol as well, denies any drug use.  He currently denies any medical complaints, states that he is feeling well.     Physical Exam   Triage Vital Signs: ED Triage Vitals  Encounter Vitals Group     BP      Girls Systolic BP Percentile      Girls Diastolic BP Percentile      Boys Systolic BP Percentile      Boys Diastolic BP Percentile      Pulse      Resp      Temp      Temp src      SpO2      Weight      Height      Head Circumference      Peak Flow      Pain Score      Pain Loc      Pain Education      Exclude from Growth Chart     Most recent vital signs: Vitals:   11/05/23 2206 11/06/23 0000  BP: (!) 184/112 (!) 160/102  Pulse: 87 72  Resp: 16 (!) 21  Temp: 98.8 F (37.1 C)   SpO2: 96% 98%    Constitutional: Alert and oriented. Eyes: Conjunctivae are normal. Head: Atraumatic. Nose: No congestion/rhinnorhea. Mouth/Throat: Mucous membranes are moist.  Cardiovascular: Normal rate, regular rhythm. Grossly normal heart sounds.  2+ radial pulses bilaterally. Respiratory: Normal respiratory effort.  No retractions. Lungs CTAB. Gastrointestinal: Soft and nontender. No distention. Musculoskeletal: No lower extremity tenderness nor edema.  Neurologic:  Normal speech and language. No gross focal neurologic deficits are appreciated.    ED Results / Procedures / Treatments   Labs (all labs ordered are listed, but only abnormal results are  displayed) Labs Reviewed  CBC WITH DIFFERENTIAL/PLATELET - Abnormal; Notable for the following components:      Result Value   WBC 10.8 (*)    All other components within normal limits  COMPREHENSIVE METABOLIC PANEL WITH GFR - Abnormal; Notable for the following components:   Potassium 3.4 (*)    All other components within normal limits  ETHANOL - Abnormal; Notable for the following components:   Alcohol, Ethyl (B) 28 (*)    All other components within normal limits  ACETAMINOPHEN  LEVEL - Abnormal; Notable for the following components:   Acetaminophen  (Tylenol ), Serum <10 (*)    All other components within normal limits  SALICYLATE LEVEL - Abnormal; Notable for the following components:   Salicylate Lvl <7.0 (*)    All other components within normal limits  MAGNESIUM  URINE DRUG SCREEN, QUALITATIVE ([facility] ONLY)  ACETAMINOPHEN  LEVEL  SALICYLATE LEVEL     EKG  ED ECG REPORT I, Carlin Palin, the attending physician, personally viewed and interpreted this ECG.   Date: 11/05/2023  EKG Time: 22:37  Rate: 76  Rhythm: normal sinus rhythm  Axis: Normal  Intervals:none  ST&T Change: None  PROCEDURES:  Critical  Care performed: No  Procedures   MEDICATIONS ORDERED IN ED: Medications - No data to display   IMPRESSION / MDM / ASSESSMENT AND PLAN / ED COURSE  I reviewed the triage vital signs and the nursing notes.                              43 y.o. male with past medical history of hypertension who presents to the ED for psychiatric valuation following overdose on Flexeril  and aspirin.  Patient's presentation is most consistent with acute presentation with potential threat to life or bodily function.  Differential diagnosis includes, but is not limited to, suicidal ideation, homicidal ideation, intentional overdose, anemia, electrolyte abnormality, AKI.  Patient nontoxic-appearing and in no acute distress, vital signs remarkable for hypertension but otherwise  reassuring.  Patient denies any medical complaints and screening labs without significant anemia, leukocytosis, electrolyte abnormality, or AKI.  Initial Tylenol  and salicylate levels are undetectable.  Case discussed with poison control, who recommends repeat levels as well as repeat EKG.  Patient was placed under IVC and psychiatric evaluation is pending at this time.  The patient has been placed in psychiatric observation due to the need to provide a safe environment for the patient while obtaining psychiatric consultation and evaluation, as well as ongoing medical and medication management to treat the patient's condition.  The patient has been placed under full IVC at this time.      FINAL CLINICAL IMPRESSION(S) / ED DIAGNOSES   Final diagnoses:  Suicidal ideation  Intentional overdose, initial encounter [facility])     Rx / DC Orders   ED Discharge Orders     None        Note:  This document was prepared using Dragon voice recognition software and may include unintentional dictation errors.   Willo Dunnings, MD 11/06/23 GLORIANNE

## 2023-11-06 DIAGNOSIS — F419 Anxiety disorder, unspecified: Secondary | ICD-10-CM

## 2023-11-06 DIAGNOSIS — T50912A Poisoning by multiple unspecified drugs, medicaments and biological substances, intentional self-harm, initial encounter: Secondary | ICD-10-CM

## 2023-11-06 DIAGNOSIS — F322 Major depressive disorder, single episode, severe without psychotic features: Secondary | ICD-10-CM

## 2023-11-06 LAB — URINE DRUG SCREEN, QUALITATIVE (ARMC ONLY)
Amphetamines, Ur Screen: NOT DETECTED
Barbiturates, Ur Screen: NOT DETECTED
Benzodiazepine, Ur Scrn: NOT DETECTED
Cannabinoid 50 Ng, Ur ~~LOC~~: POSITIVE — AB
Cocaine Metabolite,Ur ~~LOC~~: NOT DETECTED
MDMA (Ecstasy)Ur Screen: NOT DETECTED
Methadone Scn, Ur: NOT DETECTED
Opiate, Ur Screen: NOT DETECTED
Phencyclidine (PCP) Ur S: NOT DETECTED
Tricyclic, Ur Screen: NOT DETECTED

## 2023-11-06 LAB — SALICYLATE LEVEL
Salicylate Lvl: <7 mg/dL — ABNORMAL LOW (ref 7.0–30.0)
Salicylate Lvl: <7 mg/dL — ABNORMAL LOW (ref 7.0–30.0)

## 2023-11-06 LAB — SARS CORONAVIRUS 2 BY RT PCR: SARS Coronavirus 2 by RT PCR: NEGATIVE

## 2023-11-06 LAB — ACETAMINOPHEN LEVEL
Acetaminophen (Tylenol), Serum: <10 ug/mL — ABNORMAL LOW (ref 10–30)
Acetaminophen (Tylenol), Serum: <10 ug/mL — ABNORMAL LOW (ref 10–30)

## 2023-11-06 MED ORDER — NICOTINE 14 MG/24HR TD PT24
14.0000 mg | MEDICATED_PATCH | Freq: Once | TRANSDERMAL | Status: DC
  Administered 2023-11-06: 14 mg via TRANSDERMAL
  Filled 2023-11-06: qty 1

## 2023-11-06 NOTE — ED Notes (Signed)
 Dinner tray provided to pt

## 2023-11-06 NOTE — ED Provider Notes (Signed)
 Emergency Medicine Observation Re-evaluation Note  Mark Cooke is a 43 y.o. male, seen on rounds today.  Pt initially presented to the ED for complaints of Psychiatric Evaluation Currently, the patient is in no acute distress  Physical Exam  BP (!) 167/80 (BP Location: Right Arm)   Pulse 74   Temp 98.4 F (36.9 C) (Oral)   Resp 17   Ht 6' (1.829 m)   Wt 104.3 kg   SpO2 98%   BMI 31.19 kg/m  Physical Exam General: No acute distress Cardiac: Warm and well-perfused Lungs: No respiratory distress   ED Course / MDM  EKG:EKG Interpretation Date/Time:  Friday November 06 2023 00:54:46 EDT Ventricular Rate:  78 PR Interval:  198 QRS Duration:  124 QT Interval:  383 QTC Calculation: 437 R Axis:   46  Text Interpretation: Sinus rhythm Right bundle branch block Confirmed by UNCONFIRMED, DOCTOR ([id number]), editor Alex Slough [phone]) on 11/06/2023 8:18:55 AM  I have reviewed the labs performed to date as well as medications administered while in observation.  Recent changes in the last 24 hours include psych saw the patient and agrees of IVC  Plan  Current plan is for TOC/psych    Nicholaus Rolland BRAVO, MD 11/06/23 1554

## 2023-11-06 NOTE — ED Notes (Signed)
 CCMD called for cardiac monitoring.

## 2023-11-06 NOTE — ED Notes (Signed)
 Meal provided

## 2023-11-06 NOTE — ED Notes (Signed)
 When rounding on the pt, he stated that he was supposed to leave at 10 pm and would not be staying overnight. The patient was visibly upset, and his voice was elevated. Pt was informed that IVC paper was still in effect.

## 2023-11-06 NOTE — ED Notes (Signed)
 Poison Control called for a pt update. No new orders at this time from them.

## 2023-11-06 NOTE — ED Notes (Signed)
 Patient sleeping

## 2023-11-06 NOTE — Progress Notes (Addendum)
 Patient has been accepted to [facility] for 11/07/23. Patient was assigned to Adult unit. Accepting physician is Prentice Balloon, DO. Call report to [phone]. Representative was [employer].   ER Staff is aware of it: Luann ER Secretary Dr. Levander, ER MD Leonor PEAK, Patient's Nurse  Address:  [address]                 [place], [place] [address]

## 2023-11-06 NOTE — ED Notes (Signed)
Snacks given to pt.

## 2023-11-06 NOTE — ED Notes (Signed)
 Pt given sandwich tray and water

## 2023-11-06 NOTE — Progress Notes (Addendum)
 [facility] contacted patient's father Mark Cooke -  [phone]) regarding pt's acceptance to [facility]. Mark Cooke was originally not happy with son going all the way to [place], [place].  [facility] explained the placement process a few times and the importance of preventing delays in patient care.  Mark Cooke is ok now and wanted to know if he could visit son before he leaves.  [facility] provided # to [place], Charity fundraiser.   [place], [facility] [phone]

## 2023-11-06 NOTE — Procedures (Signed)
 There is no AC this morning.  Per Ole, DNP patient has been referred to the following facilities:     Service Provider Phone  [facility]  [phone]  [facility]  [phone]  [facility]  [phone]  [facility]  [phone]  [facility]  [phone]  [facility]  [phone]  [facility]  [phone]  [facility]  [phone]  [facility]  [phone]  [facility]  [phone]  [facility]  [phone]  [facility]  [phone]  [place] [facility]  [phone]  [facility]  [phone]  [facility]  [phone]  [facility]  [phone]  [facility]  [phone], [place] [phone]

## 2023-11-06 NOTE — ED Notes (Signed)
 Pt having psych consult at this time via telepsych.

## 2023-11-06 NOTE — ED Notes (Signed)
 Meal tray provided. Tray checked for potential hazards. Pt denies no other needs at this time.

## 2023-11-06 NOTE — ED Notes (Signed)
 IVC patient accepted to [facility] transport not available until first thing Sat 10/11  [facility] [place] aware

## 2023-11-06 NOTE — Consult Note (Signed)
 [facility] Consult Note  Patient Name: Mark Cooke MRN: [id number] DOB: [birth year] DATE OF Consult: 11/06/2023  PRIMARY PSYCHIATRIC DIAGNOSES  1.  Suicide attempt via overdose 2.  MDD, single episode, severe; no psychotic features 3.  Anxiety disorder  RECOMMENDATIONS  Recommendations: Medication recommendations: defer to IP setting in context of overdose Non-Medication/therapeutic recommendations: Psych admission Is inpatient psychiatric hospitalization recommended for this patient? Yes (Explain why): SI / suicide attempt Follow-Up Telepsychiatry C/L services: We will sign off for now. Please re-consult our service if needed for any concerning changes in the patient's condition, discharge planning, or questions. Communication: Treatment team members (and family members if applicable) who were involved in treatment/care discussions and planning, and with whom we spoke or engaged with via secure text/chat, include the following: RN  Thank you for involving us  in the care of this patient. If you have any additional questions or concerns, please call [phone] and ask for me or the provider on-call.   TELEPSYCHIATRY ATTESTATION & CONSENT  As the provider for this telehealth consult, I attest that I verified the patient's identity using two separate identifiers, introduced myself to the patient, provided my credentials, disclosed my location, and performed this encounter via a HIPAA-compliant, real-time, face-to-face, two-way, interactive audio and video platform and with the full consent and agreement of the patient (or guardian as applicable.)  Patient physical location: ED at [facility]. Telehealth provider physical location: home office in state of [place] .  Video start time: 1208a CST (Central Time) Video end time: 1230a CST (Central Time)  IDENTIFYING DATA  Mark Cooke is a 43 y.o. year-old male for whom a psychiatric consultation has been ordered by the  primary provider. The patient was identified using two separate identifiers.  CHIEF COMPLAINT/REASON FOR CONSULT   Depression; suicidal ideation; suicide attempt   HISTORY OF PRESENT ILLNESS (HPI)  The patient is a 43 year old man with minimal mental health treatment history; presenting to ED after a self-harm attempt via overdose on aspiirin, muscle relaxant., and alcohol. Psychiatry consulted given above.  When seen, the patient states he feels A little bit better and states he's had severe, unrelenting anxiety for days, and then he decided to take the meds as above in an effort to end it all. He's not sure who called EMS to get him to ED. He denies active SI at this very time but does state his intent at time was to die. Denies HI, denies AVH. Reports stressors including relationship issues and recently cutting back on alcohol use, stating since September he's had maybe 4-5 beers total except last night when he had a large amount of alcohol. Reports THC use.  Reports his brother died from addiction-related issues. Reports an ex-wife had h/o bipolar-1. One of his daughters reportedly has depression. He lives by himself. He has 4 kids but doesn't talk to them often. Reports struggling with anxiety for a long time (self-medicating with marijuana, by his own description) with more rapid onset of depression over past 3 months. I reviewed concern for acute safety and recommendation for acute stabilization via hospitalization. Patient voiced understanding.   PAST PSYCHIATRIC HISTORY  No prior med trials, Psych admissions Denies prior SA/SIB No known history of abuse/trauma   Otherwise as per HPI above.  PAST MEDICAL HISTORY  Past Medical History:  Diagnosis Date   Hypertension      HOME MEDICATIONS     ALLERGIES  No Known Allergies  SOCIAL & SUBSTANCE USE HISTORY  Social History   Socioeconomic  History   Marital status: Married    Spouse name: Not on file   Number of children: Not  on file   Years of education: Not on file   Highest education level: Not on file  Occupational History   Not on file  Tobacco Use   Smoking status: Never   Smokeless tobacco: Never  Substance and Sexual Activity   Alcohol use: Not on file   Drug use: Not on file   Sexual activity: Not on file  Other Topics Concern   Not on file  Social History Narrative   Not on file   Social Drivers of Health   Financial Resource Strain: Not on file  Food Insecurity: Not on file  Transportation Needs: Not on file  Physical Activity: Not on file  Stress: Not on file  Social Connections: Not on file   Social History   Tobacco Use  Smoking Status Never  Smokeless Tobacco Never   Social History   Substance and Sexual Activity  Alcohol Use None   Social History   Substance and Sexual Activity  Drug Use Not on file    Additional pertinent information: lives by self.  FAMILY HISTORY  History reviewed. No pertinent family history. Family Psychiatric History (if known):  brother with addiction  MENTAL STATUS EXAM (MSE)  Mental Status Exam: General Appearance: hospital scrubs, partially covered in blanket  Orientation:  Full (Time, Place, and Person)  Memory:  fair  Concentration:  good  Recall:  Fair  Attention  Fair  Eye Contact:  Good  Speech:  Normal Rate  Language:  fluent English  Volume:  Normal  Mood: A little bit better - patient  Affect:  Constricted and Depressed  Thought Process:  Coherent and Linear  Thought Content:  themes of hopelessness  Suicidal Thoughts:  Yes.  with intent/plan  Homicidal Thoughts:  No  Judgement:  Impaired  Insight:  limited  Psychomotor Activity:  no overt PMA/PMR evident  Akathisia:  none evident  Fund of Knowledge:  fair     VITALS  Blood pressure (!) 155/112, pulse 64, temperature 98.8 F (37.1 C), temperature source Oral, resp. rate 17, height 6' (1.829 m), weight 104.3 kg, SpO2 97%.  LABS  Admission on 11/05/2023   Component Date Value Ref Range Status   WBC 11/05/2023 10.8 (H)  4.0 - 10.5 K/uL Final   RBC 11/05/2023 5.12  4.22 - 5.81 MIL/uL Final   Hemoglobin 11/05/2023 16.1  13.0 - 17.0 g/dL Final   HCT 89/90/7974 46.6  39.0 - 52.0 % Final   MCV 11/05/2023 91.0  80.0 - 100.0 fL Final   MCH 11/05/2023 31.4  26.0 - 34.0 pg Final   MCHC 11/05/2023 34.5  30.0 - 36.0 g/dL Final   RDW 89/90/7974 12.0  11.5 - 15.5 % Final   Platelets 11/05/2023 220  150 - 400 K/uL Final   nRBC 11/05/2023 0.0  0.0 - 0.2 % Final   Neutrophils Relative % 11/05/2023 66  % Final   Neutro Abs 11/05/2023 7.1  1.7 - 7.7 K/uL Final   Lymphocytes Relative 11/05/2023 25  % Final   Lymphs Abs 11/05/2023 2.6  0.7 - 4.0 K/uL Final   Monocytes Relative 11/05/2023 7  % Final   Monocytes Absolute 11/05/2023 0.8  0.1 - 1.0 K/uL Final   Eosinophils Relative 11/05/2023 1  % Final   Eosinophils Absolute 11/05/2023 0.1  0.0 - 0.5 K/uL Final   Basophils Relative 11/05/2023 1  % Final  Basophils Absolute 11/05/2023 0.1  0.0 - 0.1 K/uL Final   Immature Granulocytes 11/05/2023 0  % Final   Abs Immature Granulocytes 11/05/2023 0.04  0.00 - 0.07 K/uL Final   Performed at [facility], [address] [place], [place] [address]   Sodium 11/05/2023 141  135 - 145 mmol/L Final   Potassium 11/05/2023 3.4 (L)  3.5 - 5.1 mmol/L Final   Chloride 11/05/2023 105  98 - 111 mmol/L Final   CO2 11/05/2023 25  22 - 32 mmol/L Final   Glucose, Bld 11/05/2023 87  70 - 99 mg/dL Final   Glucose reference range applies only to samples taken after fasting for at least 8 hours.   BUN 11/05/2023 9  6 - 20 mg/dL Final   Creatinine, Ser 11/05/2023 1.11  0.61 - 1.24 mg/dL Final   Calcium 89/90/7974 9.5  8.9 - 10.3 mg/dL Final   Total Protein 89/90/7974 7.4  6.5 - 8.1 g/dL Final   Albumin 89/90/7974 3.9  3.5 - 5.0 g/dL Final   AST 89/90/7974 17  15 - 41 U/L Final   ALT 11/05/2023 24  0 - 44 U/L Final   Alkaline Phosphatase 11/05/2023 49  38 - 126 U/L  Final   Total Bilirubin 11/05/2023 0.6  0.0 - 1.2 mg/dL Final   GFR, Estimated 11/05/2023 >60  >60 mL/min Final   Comment: (NOTE) Calculated using the CKD-EPI Creatinine Equation (2021)    Anion gap 11/05/2023 11  5 - 15 Final   Performed at [facility], [address] [place], [place] [address]   Alcohol, Ethyl (B) 11/05/2023 28 (H)  <15 mg/dL Final   Comment: (NOTE) For medical purposes only. Performed at [facility], [address] [place], [place] [address]    Tricyclic, Ur Screen 11/05/2023 NONE DETECTED  NONE DETECTED Final   Amphetamines, Ur Screen 11/05/2023 NONE DETECTED  NONE DETECTED Final   MDMA (Ecstasy)Ur Screen 11/05/2023 NONE DETECTED  NONE DETECTED Final   Cocaine Metabolite,Ur [place] 11/05/2023 NONE DETECTED  NONE DETECTED Final   Opiate, Ur Screen 11/05/2023 NONE DETECTED  NONE DETECTED Final   Phencyclidine (PCP) Ur S 11/05/2023 NONE DETECTED  NONE DETECTED Final   Cannabinoid 50 Ng, Ur [place] 11/05/2023 POSITIVE (A)  NONE DETECTED Final   Barbiturates, Ur Screen 11/05/2023 NONE DETECTED  NONE DETECTED Final   Benzodiazepine, Ur Scrn 11/05/2023 NONE DETECTED  NONE DETECTED Final   Methadone Scn, Ur 11/05/2023 NONE DETECTED  NONE DETECTED Final   Comment: (NOTE) Tricyclics + metabolites, urine    Cutoff 1000 ng/mL Amphetamines + metabolites, urine  Cutoff 1000 ng/mL MDMA (Ecstasy), urine              Cutoff 500 ng/mL Cocaine Metabolite, urine          Cutoff 300 ng/mL Opiate + metabolites, urine        Cutoff 300 ng/mL Phencyclidine (PCP), urine         Cutoff 25 ng/mL Cannabinoid, urine                 Cutoff 50 ng/mL Barbiturates + metabolites, urine  Cutoff 200 ng/mL Benzodiazepine, urine              Cutoff 200 ng/mL Methadone, urine                   Cutoff 300 ng/mL  The urine drug screen provides only a preliminary, unconfirmed analytical test result and should not be used for  non-medical purposes. Clinical consideration and professional  judgment should be applied to any positive drug screen result due to possible interfering substances. A more specific alternate chemical method must be used in order to obtain a confirmed analytical result. Gas chromatography / mass spectrometry (GC/MS) is the preferred confirm                          atory method. Performed at [facility], [address] [place], [place] [address]    Acetaminophen  (Tylenol ), Serum 11/05/2023 <10 (L)  10 - 30 ug/mL Final   Comment: (NOTE) Therapeutic concentrations vary significantly. A range of 10-30 ug/mL  may be an effective concentration for many patients. However, some  are best treated at concentrations outside of this range. Acetaminophen  concentrations >150 ug/mL at 4 hours after ingestion  and >50 ug/mL at 12 hours after ingestion are often associated with  toxic reactions.  Performed at [facility], [address] [place], [place] [address]    Salicylate Lvl 11/05/2023 <7.0 (L)  7.0 - 30.0 mg/dL Final   Performed at [facility], [address] [place], [place] [address]   Magnesium 11/05/2023 2.4  1.7 - 2.4 mg/dL Final   Performed at [facility], [address] [place], [place] [address]    PSYCHIATRIC REVIEW OF SYSTEMS (ROS)  ROS: Notable for the following relevant positive findings: ROS - depressed mood, guilt/shame; hopelessness, worthlessness; insomnia; variable appetite;l suicide attempt  Additional findings:      Musculoskeletal: No abnormal movements observed      Gait & Station: Laying/Sitting      Pain Screening: Denies      Nutrition & Dental Concerns: Decrease in food intake and/or loss of appetite  RISK FORMULATION/ASSESSMENT  Is the patient experiencing any suicidal or homicidal ideations: Yes       Explain if yes: s/p suicide attempt via overdose Protective factors considered for safety management: children; employment  Risk factors/concerns considered for safety  management:  Depression Substance abuse/dependence Recent loss Hopelessness Male gender  Is there a safety management plan with the patient and treatment team to minimize risk factors and promote protective factors: Yes           Explain: psych admission Is crisis care placement or psychiatric hospitalization recommended: Yes     Based on my current evaluation and risk assessment, patient is determined at this time to be at:  High risk  *RISK ASSESSMENT Risk assessment is a dynamic process; it is possible that this patient's condition, and risk level, may change. This should be re-evaluated and managed over time as appropriate. Please re-consult psychiatric consult services if additional assistance is needed in terms of risk assessment and management. If your team decides to discharge this patient, please advise the patient how to best access emergency psychiatric services, or to call 911, if their condition worsens or they feel unsafe in any way.   Nancyann LITTIE Alert, MD Telepsychiatry Consult Services

## 2023-11-07 MED ORDER — HALOPERIDOL LACTATE 5 MG/ML IJ SOLN
5.0000 mg | Freq: Once | INTRAMUSCULAR | Status: DC
Start: 2023-11-07 — End: 2023-11-07
  Filled 2023-11-07: qty 1

## 2023-11-07 MED ORDER — LORAZEPAM 2 MG/ML IJ SOLN
1.0000 mg | Freq: Once | INTRAMUSCULAR | Status: DC
  Filled 2023-11-07: qty 1

## 2023-11-07 MED ORDER — DIPHENHYDRAMINE HCL 50 MG/ML IJ SOLN
50.0000 mg | Freq: Once | INTRAMUSCULAR | Status: DC
  Filled 2023-11-07: qty 1

## 2023-11-07 NOTE — ED Provider Notes (Signed)
 Emergency Medicine Observation Re-evaluation Note  Mark Cooke is a 43 y.o. male, seen on rounds today.  Pt initially presented to the ED for complaints of Psychiatric Evaluation  Currently, the patient is is no acute distress. Denies any concerns at this time.  Physical Exam  Blood pressure (!) 190/101, pulse 76, temperature 98.9 F (37.2 C), resp. rate 17, height 6' (1.829 m), weight 104.3 kg, SpO2 99%.  Physical Exam: General: No apparent distress Pulm: Normal WOB Neuro: Moving all extremities Psych: Resting comfortably     ED Course / MDM     I have reviewed the labs performed to date as well as medications administered while in observation.  Recent changes in the last 24 hours include: No acute events overnight.  Plan   Current plan: Patient awaiting psychiatric disposition.   Patient is under IVC and has been accepted to [facility] for inpatient psychiatric treatment this morning.   Jeorgia Helming, Josette SAILOR, DO 11/07/23 [phone]

## 2023-11-07 NOTE — ED Notes (Signed)
 Report called to [facility].

## 2023-11-07 NOTE — ED Notes (Signed)
 IVC/ accepted to [facility] this am.

## 2023-11-07 NOTE — ED Notes (Signed)
 Dr. Jossie able to talk pt down. PRN's not given. Pt appropriate for move to [facility].

## 2023-11-07 NOTE — ED Notes (Signed)
Attempted to call report x1 with no success

## 2023-11-07 NOTE — ED Notes (Signed)
 EDT and security attempted to move pt to [facility], pt refused and said he wants to go home. Pt acting out yelling, cussing. Security at door.

## 2024-05-21 IMAGING — CT CT HEAD W/O CM
4 series · 16 of 47 positions shown, 18 images · non-contrast
Comparison: None Available.

CLINICAL DATA: Chronic headaches



[Series 2: head wo · axial · 0.42mm/px · z∈[-103,+22]mm · 7 of 35 slices shown, 9 images]
[im 5/35  brain]
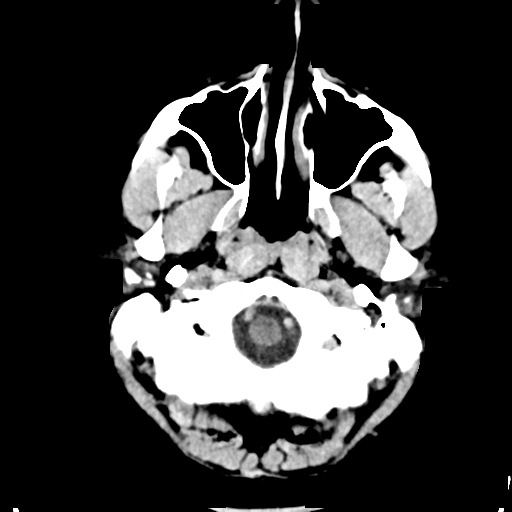
[im 5/35  bone]
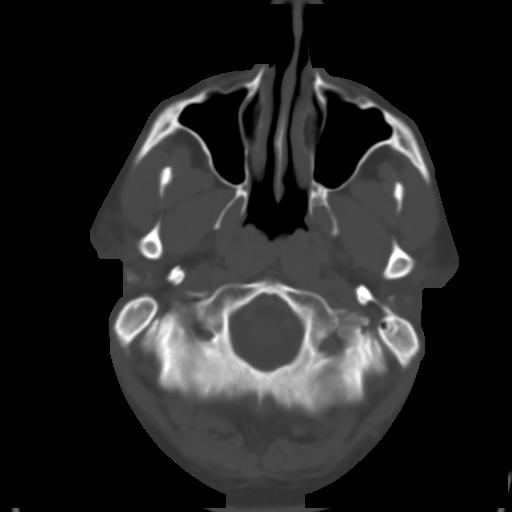
[im 9/35  brain]
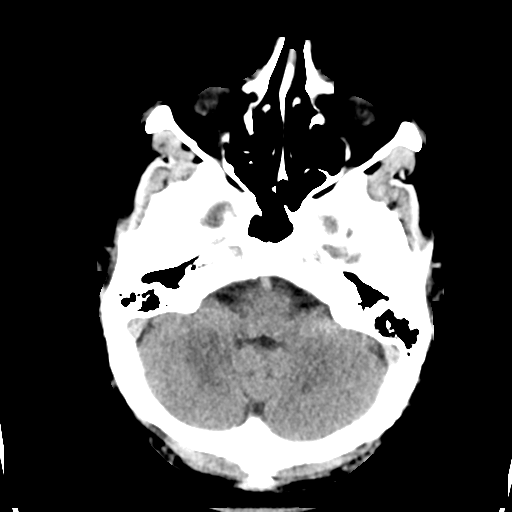
[im 13/35  brain]
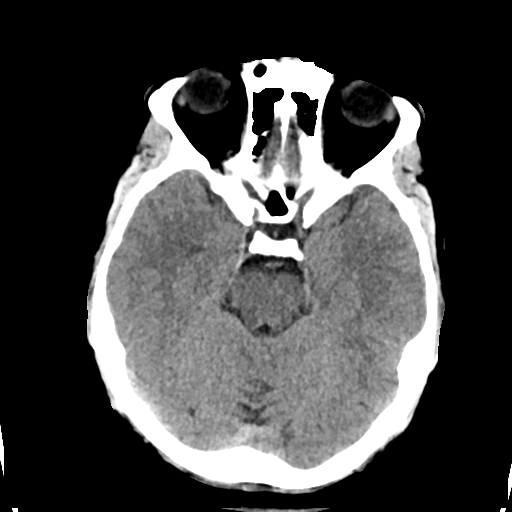
[im 18/35  brain]
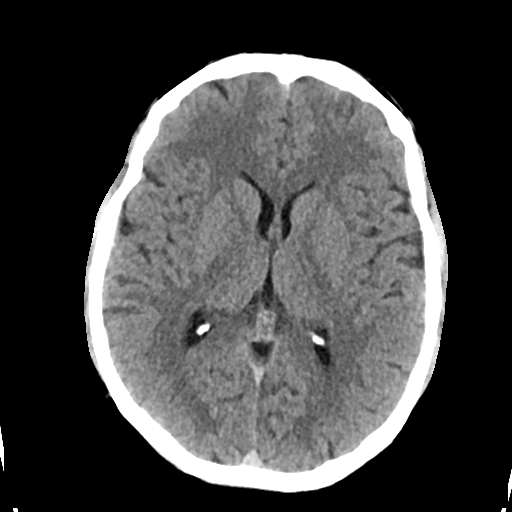
[im 22/35  brain]
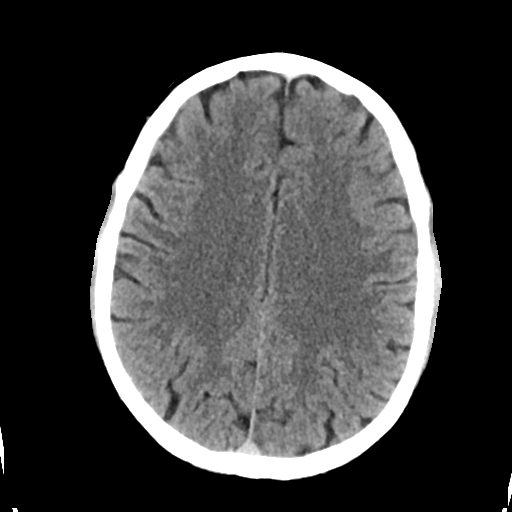
[im 22/35  bone]
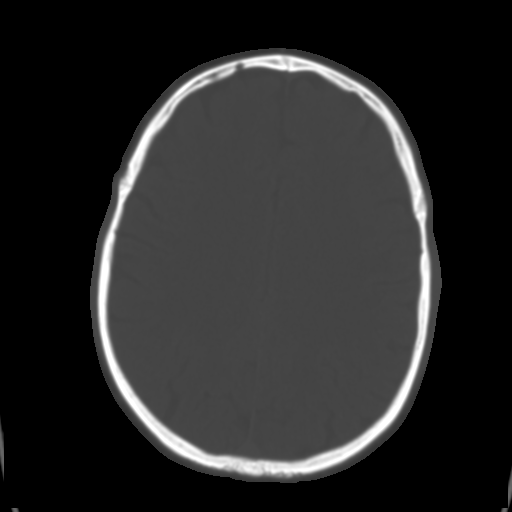
[im 26/35  brain]
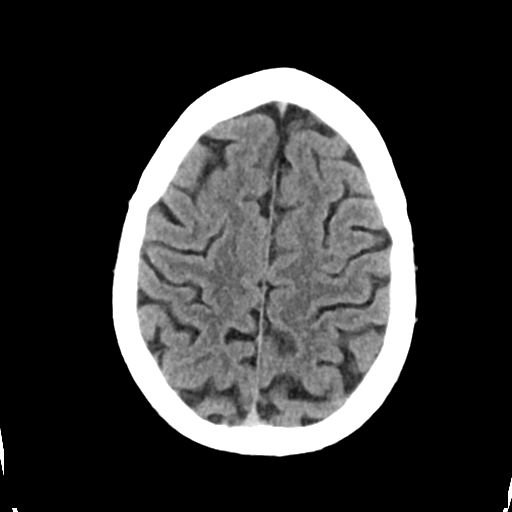
[im 30/35  brain]
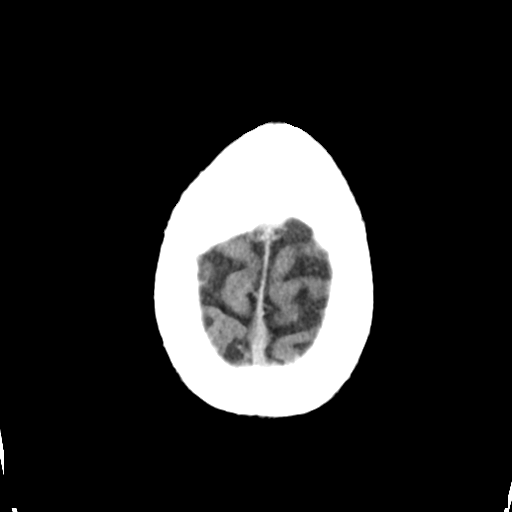

[Series 3: head bone · axial · 0.42mm/px · z∈[-107,-71]mm · 3 of 88 slices shown]
[im 9/88  bone]
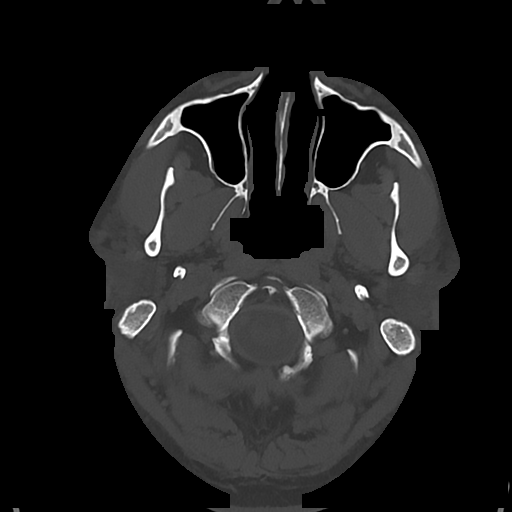
[im 18/88  bone]
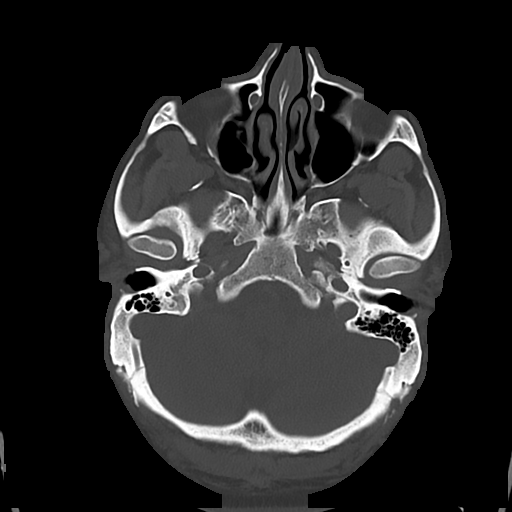
[im 27/88  bone]
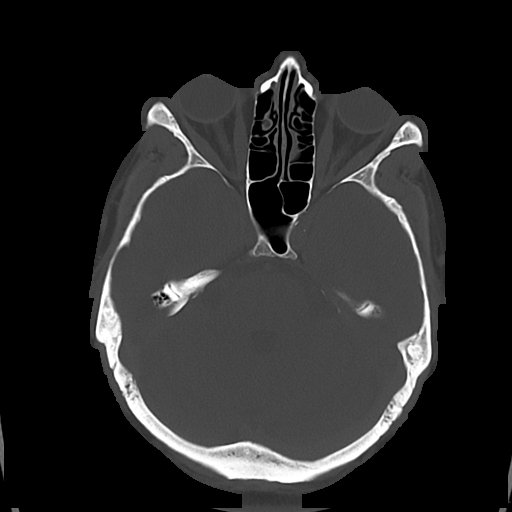

[Series 4: cor soft · coronal · 0.31mm/px · 3 of 75 slices shown]
[im 25/75  brain]
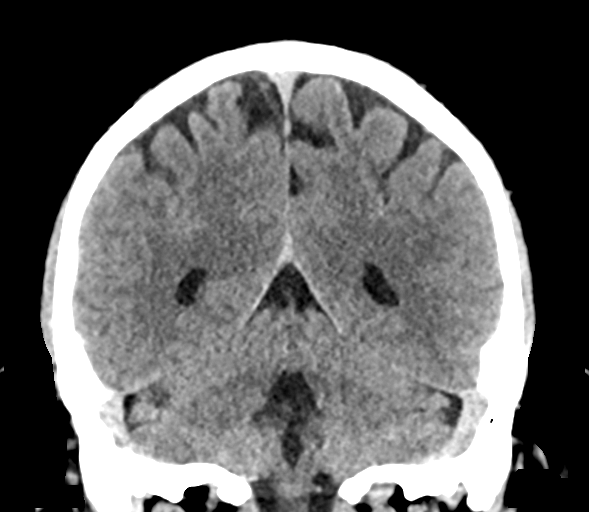
[im 33/75  brain]
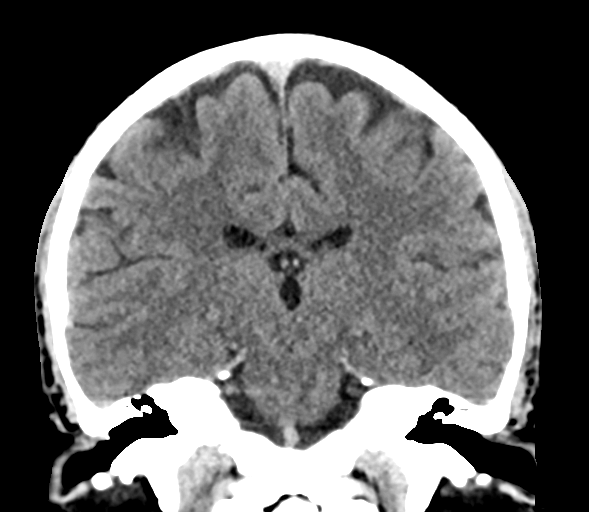
[im 42/75  brain]
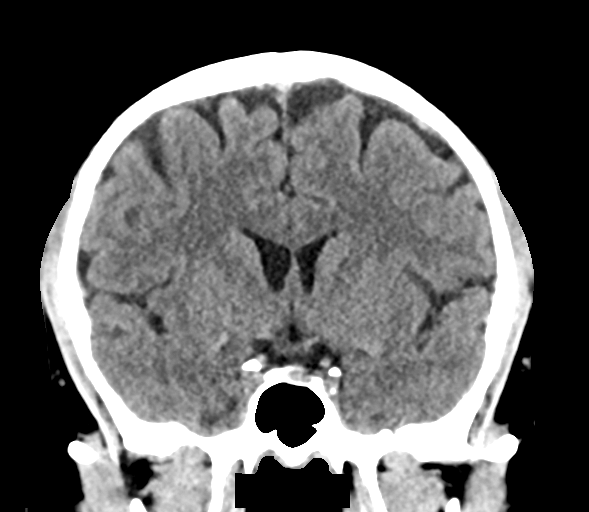

[Series 5: sag soft · sagittal · 0.31mm/px · 3 of 62 slices shown]
[im 21/62  brain]
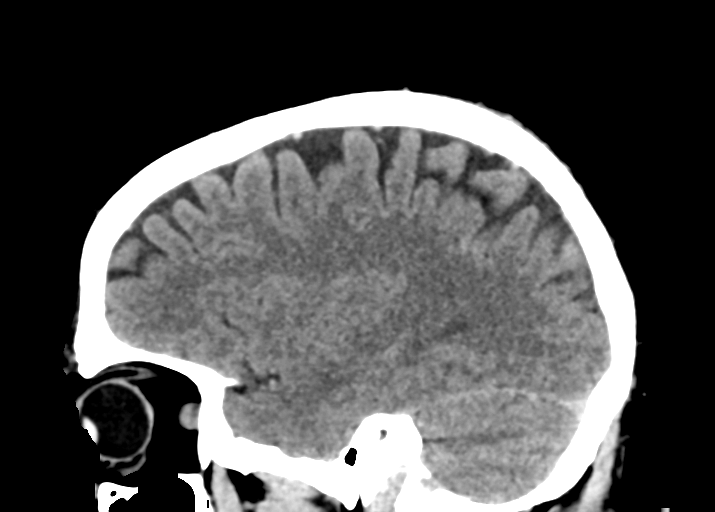
[im 31/62  brain]
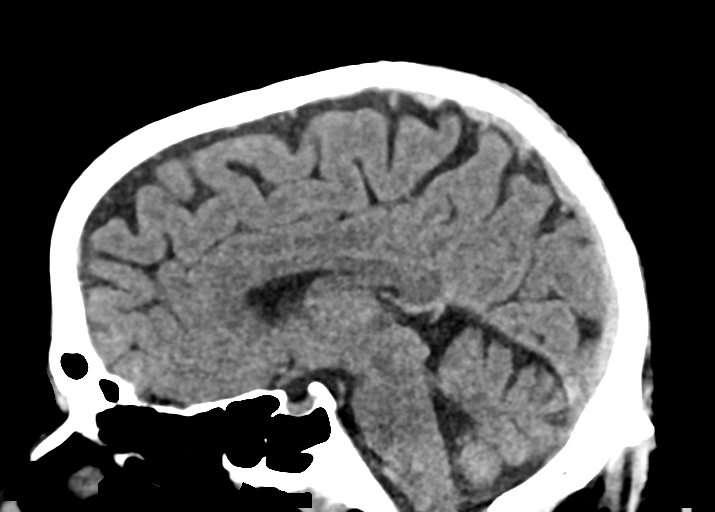
[im 41/62  brain]
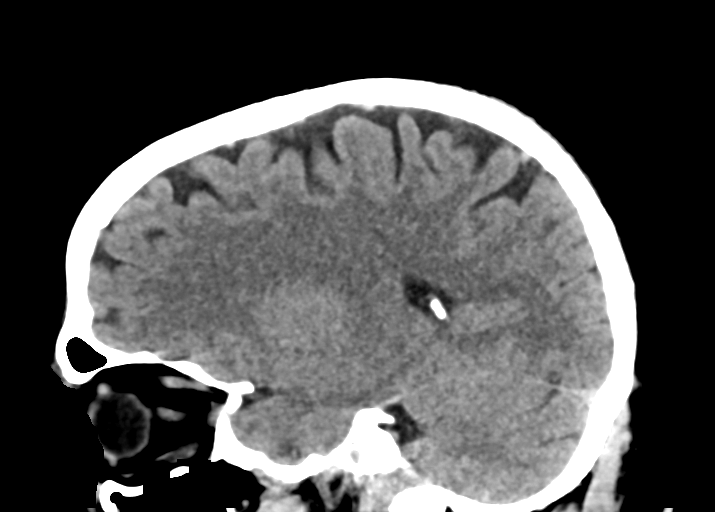

[16 of 47 positions shown; findings below may reference images not displayed]

FINDINGS: Brain: No acute intracranial findings are seen. There are no signs
of bleeding within the cranium. Ventricles are not dilated. Cortical
sulci are prominent. There is no focal edema or mass effect.

Vascular: Unremarkable.

Skull: Unremarkable.

Sinuses/Orbits: Unremarkable.

Other: None.
IMPRESSION: No acute intracranial findings are seen in noncontrast CT brain.
Cortical sulci are prominent suggesting atrophy.
